# Patient Record
Sex: Male | Born: 1965 | Race: Black or African American | Hispanic: No | Marital: Married | Smoking: Never smoker
Health system: Southern US, Community
[De-identification: ages and names within clinical notes are randomized; demographics above are authoritative.]

## PROBLEM LIST (undated history)

## (undated) DIAGNOSIS — E119 Type 2 diabetes mellitus without complications: Secondary | ICD-10-CM

## (undated) DIAGNOSIS — E785 Hyperlipidemia, unspecified: Secondary | ICD-10-CM

## (undated) HISTORY — DX: Hyperlipidemia, unspecified: E78.5

## (undated) HISTORY — PX: HERNIA REPAIR: SHX51

## (undated) HISTORY — DX: Type 2 diabetes mellitus without complications: E11.9

---

## 2001-04-21 ENCOUNTER — Emergency Department (HOSPITAL_COMMUNITY): Admission: EM | Admit: 2001-04-21 | Discharge: 2001-04-21 | Payer: Self-pay | Admitting: Emergency Medicine

## 2017-03-20 ENCOUNTER — Ambulatory Visit: Payer: Self-pay | Admitting: Physician Assistant

## 2017-03-26 ENCOUNTER — Encounter: Payer: Self-pay | Admitting: Physician Assistant

## 2017-03-26 ENCOUNTER — Telehealth: Payer: Self-pay

## 2017-03-26 ENCOUNTER — Ambulatory Visit (INDEPENDENT_AMBULATORY_CARE_PROVIDER_SITE_OTHER): Payer: 59 | Admitting: Physician Assistant

## 2017-03-26 VITALS — BP 132/80 | HR 86 | Temp 98.8°F | Resp 16 | Ht 72.5 in | Wt 278.8 lb

## 2017-03-26 DIAGNOSIS — E118 Type 2 diabetes mellitus with unspecified complications: Secondary | ICD-10-CM | POA: Diagnosis not present

## 2017-03-26 DIAGNOSIS — E781 Pure hyperglyceridemia: Secondary | ICD-10-CM

## 2017-03-26 LAB — POCT GLYCOSYLATED HEMOGLOBIN (HGB A1C): Hemoglobin A1C: 10.8

## 2017-03-26 MED ORDER — ICOSAPENT ETHYL 1 G PO CAPS: 1.0000 mg | ORAL_CAPSULE | Freq: Every day | ORAL | 1 refills | Status: DC

## 2017-03-26 MED ORDER — METFORMIN HCL ER (MOD) 1000 MG PO TB24: 1000.0000 mg | ORAL_TABLET | Freq: Two times a day (BID) | ORAL | 1 refills | Status: DC

## 2017-03-26 MED ORDER — DAPAGLIFLOZIN PROPANEDIOL 5 MG PO TABS: 5.0000 mg | ORAL_TABLET | Freq: Every day | ORAL | 1 refills | Status: DC

## 2017-03-26 NOTE — Patient Instructions (Addendum)
Please take the medication as prescribed. I would like you to attempt to get in 30 minutes of aerobic activity 4 times per week.   I would like you to return in 6 weeks after taking the VASCEPA, for the lipid panel.  Please fast prior to coming in.   You will then also return in 3 months so we can recheck the diabetes.   Diabetes Mellitus and Food It is important for you to manage your blood sugar (glucose) level. Your blood glucose level can be greatly affected by what you eat. Eating healthier foods in the appropriate amounts throughout the day at about the same time each day will help you control your blood glucose level. It can also help slow or prevent worsening of your diabetes mellitus. Healthy eating may even help you improve the level of your blood pressure and reach or maintain a healthy weight. General recommendations for healthful eating and cooking habits include:  Eating meals and snacks regularly. Avoid going long periods of time without eating to lose weight.  Eating a diet that consists mainly of plant-based foods, such as fruits, vegetables, nuts, legumes, and whole grains.  Using low-heat cooking methods, such as baking, instead of high-heat cooking methods, such as deep frying.  Work with your dietitian to make sure you understand how to use the Nutrition Facts information on food labels. How can food affect me? Carbohydrates Carbohydrates affect your blood glucose level more than any other type of food. Your dietitian will help you determine how many carbohydrates to eat at each meal and teach you how to count carbohydrates. Counting carbohydrates is important to keep your blood glucose at a healthy level, especially if you are using insulin or taking certain medicines for diabetes mellitus. Alcohol Alcohol can cause sudden decreases in blood glucose (hypoglycemia), especially if you use insulin or take certain medicines for diabetes mellitus. Hypoglycemia can be a  life-threatening condition. Symptoms of hypoglycemia (sleepiness, dizziness, and disorientation) are similar to symptoms of having too much alcohol. If your health care provider has given you approval to drink alcohol, do so in moderation and use the following guidelines:  Women should not have more than one drink per day, and men should not have more than two drinks per day. One drink is equal to: ? 12 oz of beer. ? 5 oz of wine. ? 1 oz of hard liquor.  Do not drink on an empty stomach.  Keep yourself hydrated. Have water, diet soda, or unsweetened iced tea.  Regular soda, juice, and other mixers might contain a lot of carbohydrates and should be counted.  What foods are not recommended? As you make food choices, it is important to remember that all foods are not the same. Some foods have fewer nutrients per serving than other foods, even though they might have the same number of calories or carbohydrates. It is difficult to get your body what it needs when you eat foods with fewer nutrients. Examples of foods that you should avoid that are high in calories and carbohydrates but low in nutrients include:  Trans fats (most processed foods list trans fats on the Nutrition Facts label).  Regular soda.  Juice.  Candy.  Sweets, such as cake, pie, doughnuts, and cookies.  Fried foods.  What foods can I eat? Eat nutrient-rich foods, which will nourish your body and keep you healthy. The food you should eat also will depend on several factors, including:  The calories you need.  The medicines you  take.  Your weight.  Your blood glucose level.  Your blood pressure level.  Your cholesterol level.  You should eat a variety of foods, including:  Protein. ? Lean cuts of meat. ? Proteins low in saturated fats, such as fish, egg whites, and beans. Avoid processed meats.  Fruits and vegetables. ? Fruits and vegetables that may help control blood glucose levels, such as apples,  mangoes, and yams.  Dairy products. ? Choose fat-free or low-fat dairy products, such as milk, yogurt, and cheese.  Grains, bread, pasta, and rice. ? Choose whole grain products, such as multigrain bread, whole oats, and brown rice. These foods may help control blood pressure.  Fats. ? Foods containing healthful fats, such as nuts, avocado, olive oil, canola oil, and fish.  Does everyone with diabetes mellitus have the same meal plan? Because every person with diabetes mellitus is different, there is not one meal plan that works for everyone. It is very important that you meet with a dietitian who will help you create a meal plan that is just right for you. This information is not intended to replace advice given to you by your health care provider. Make sure you discuss any questions you have with your health care provider. Document Released: 07/10/2005 Document Revised: 03/20/2016 Document Reviewed: 09/09/2013 Elsevier Interactive Patient Education  2017 [employer].     IF you received an x-ray today, you will receive an invoice from [facility]. Please contact [facility] at [phone] with questions or concerns regarding your invoice.   IF you received labwork today, you will receive an invoice from [facility]. Please contact [facility] at [phone] with questions or concerns regarding your invoice.   Our billing staff will not be able to assist you with questions regarding bills from these companies.  You will be contacted with the lab results as soon as they are available. The fastest way to get your results is to activate your My Chart account. Instructions are located on the last page of this paperwork. If you have not heard from [place] regarding the results in 2 weeks, please contact this office.

## 2017-03-26 NOTE — Telephone Encounter (Signed)
[facility] [place] to cancel the extended release Metformin per Andrew Reeves. [facility] stated that the called in prescription wasn't covered under the patients insurance anyway.

## 2017-03-26 NOTE — Progress Notes (Signed)
PRIMARY CARE AT [facility] [address] [place] [address] [phone]  Date:  03/26/2017   Name:  Andrew Reeves   DOB:  11-04-1965   MRN:  [id number]  PCP:  Joretta Bachelor, PA    History of Present Illness:  Andrew Reeves is a 51 y.o. male patient who presents to PCP with  Chief Complaint  Patient presents with  . Establish Care  . Medication Refill    METFORMIN, VASCEPA, FARXIGA     Patient is new to our practice He is here for medication refill, and establish care. Reports that he checks his blood sugar about 150-185.  He notes that he has been without the farxiga for 1 month.  He is not watching his food intake.  Travels from [place] to [place] for work 5 days a week.  Eats fast food. Also has hx of hypertriglyceridemia.  He has been without the vascepa for about 1 month as well.  Notes no cp, palpitations, sob, numbness or tingling, vision changes, or leg swelling.  Diarrhea may be with the metformin but otherwise normal.  No polydipsia, tremulous, or polyuria.  There are no active problems to display for this patient.   Past Medical History:  Diagnosis Date  . Diabetes mellitus without complication ([place])     History reviewed. No pertinent surgical history.  Social History  Substance Use Topics  . Smoking status: Never Smoker  . Smokeless tobacco: Never Used  . Alcohol use No    Family History  Problem Relation Age of Onset  . Diabetes Mother   . Stroke Father   . Heart disease Father        HEART ATTACK    Not on File  Medication list has been reviewed and updated.  No current outpatient prescriptions on file prior to visit.   No current facility-administered medications on file prior to visit.     ROS ROS otherwise unremarkable unless listed above.  Physical Examination: BP 132/80 (BP Location: Right Arm, Patient Position: Sitting, Cuff Size: Large)   Pulse 86   Temp 98.8 F (37.1 C) (Oral)   Resp 16   Ht 6' 0.5" (1.842 m)    Wt 278 lb 12.8 oz (126.5 kg)   SpO2 96%   BMI 37.29 kg/m  Ideal Body Weight: Weight in (lb) to have BMI = 25: 186.5  Physical Exam  Constitutional: He is oriented to person, place, and time. He appears well-developed and well-nourished. No distress.  HENT:  Head: Normocephalic and atraumatic.  Eyes: Conjunctivae and EOM are normal. Pupils are equal, round, and reactive to light.  Cardiovascular: Normal rate, regular rhythm and normal heart sounds.  Exam reveals no gallop and no friction rub.   No murmur heard. Pulmonary/Chest: Effort normal. No apnea. No respiratory distress. He has no decreased breath sounds. He has no wheezes. He has no rhonchi.  Feet:  Right Foot:  Protective Sensation: 6 sites tested. 6 sites sensed.  Skin Integrity: Negative for ulcer, blister or skin breakdown.  Left Foot:  Protective Sensation: 6 sites tested. 6 sites sensed.  Skin Integrity: Negative for ulcer, blister or skin breakdown.  Neurological: He is alert and oriented to person, place, and time.  Skin: Skin is warm and dry. He is not diaphoretic.  Psychiatric: He has a normal mood and affect. His behavior is normal.    Results for orders placed or performed in visit on 03/26/17  POCT glycosylated hemoglobin (Hb A1C)  Result Value Ref  Range   Hemoglobin A1C 10.8     Assessment and Plan: Andrew Reeves is a 51 y.o. male who is here today for cc of diabetes mellitus, medication refill, and hypertriglyceridemia. Will need to obtain medical records.  Advised to return in 3 months for diabetes recheck. Advised to return in 6 weeks for lipid panel once restarted on the VASCEPA.    Type 2 diabetes mellitus with complication, without long-term current use of insulin ([place]) - Plan: POCT glycosylated hemoglobin (Hb A1C), CMP14+EGFR, Icosapent Ethyl (VASCEPA) 1 g CAPS, dapagliflozin propanediol (FARXIGA) 5 MG TABS tablet, Microalbumin, urine, DISCONTINUED: metFORMIN (GLUMETZA) 1000 MG (MOD) 24 hr  tablet  Hypertriglyceridemia - Plan: Icosapent Ethyl (VASCEPA) 1 g CAPS, Lipid panel  [name], PA-C [facility] 6/3/20188:56 AM

## 2017-03-27 LAB — MICROALBUMIN, URINE: MICROALBUM., U, RANDOM: 45.4 ug/mL

## 2017-03-27 LAB — CMP14+EGFR
A/G RATIO: 1.6 (ref 1.2–2.2)
ALBUMIN: 4.6 g/dL (ref 3.5–5.5)
ALK PHOS: 78 IU/L (ref 39–117)
ALT: 31 IU/L (ref 0–44)
AST: 24 IU/L (ref 0–40)
BILIRUBIN TOTAL: 0.4 mg/dL (ref 0.0–1.2)
BUN / CREAT RATIO: 13 (ref 9–20)
BUN: 12 mg/dL (ref 6–24)
CHLORIDE: 99 mmol/L (ref 96–106)
CO2: 21 mmol/L (ref 18–29)
Calcium: 9.5 mg/dL (ref 8.7–10.2)
Creatinine, Ser: 0.96 mg/dL (ref 0.76–1.27)
GFR calc non Af Amer: 90 mL/min/{1.73_m2} (ref >59)
GFR, EST AFRICAN AMERICAN: 104 mL/min/{1.73_m2} (ref >59)
GLOBULIN, TOTAL: 2.8 g/dL (ref 1.5–4.5)
GLUCOSE: 139 mg/dL — AB (ref 65–99)
Potassium: 4.4 mmol/L (ref 3.5–5.2)
SODIUM: 138 mmol/L (ref 134–144)
TOTAL PROTEIN: 7.4 g/dL (ref 6.0–8.5)

## 2017-03-27 MED ORDER — METFORMIN HCL 1000 MG PO TABS: 1000.0000 mg | ORAL_TABLET | Freq: Two times a day (BID) | ORAL | 3 refills | Status: DC

## 2017-03-27 NOTE — Telephone Encounter (Signed)
Please resend correct dose to pharmacy today, he is out. Not extended release metformin

## 2017-03-27 NOTE — Telephone Encounter (Signed)
I was told it was called in and the extended release was cancelled.  Ordered.  Please advise patient.

## 2017-03-27 NOTE — Telephone Encounter (Signed)
Called plain metformin  to [facility], I was on hold for 10 minutes so I called back and l/m with rx info.  Wife states his vascepa should be bid not qd, so I asked pharmacy to confirm and call us back , wife advised this may take a few days

## 2017-03-29 DIAGNOSIS — E119 Type 2 diabetes mellitus without complications: Secondary | ICD-10-CM | POA: Insufficient documentation

## 2017-04-10 ENCOUNTER — Telehealth: Payer: Self-pay | Admitting: Physician Assistant

## 2017-04-10 ENCOUNTER — Other Ambulatory Visit: Payer: 59 | Admitting: Family Medicine

## 2017-04-10 DIAGNOSIS — E781 Pure hyperglyceridemia: Secondary | ICD-10-CM

## 2017-04-10 NOTE — Telephone Encounter (Signed)
Can you add this please ?

## 2017-04-10 NOTE — Telephone Encounter (Signed)
Steph please advise

## 2017-04-10 NOTE — Telephone Encounter (Signed)
Pt has a lab order for a lipid panel but also wants a CBC drawn can this be added to the lab order   Best number [phone]

## 2017-04-10 NOTE — Telephone Encounter (Signed)
Pts wife is calling again to check and see if pt did a cholesterol and CBC like she had requested.  I advised that I only see a lipid panel screen and that labs typically take a few days to come back and will then be released to MyChart.  Pts wife was not happy with this answer.  Please advise [phone]

## 2017-04-11 LAB — LIPID PANEL
Chol/HDL Ratio: 4 ratio (ref 0.0–5.0)
Cholesterol, Total: 148 mg/dL (ref 100–199)
HDL: 37 mg/dL — AB (ref >39)
LDL Calculated: 70 mg/dL (ref 0–99)
Triglycerides: 203 mg/dL — ABNORMAL HIGH (ref 0–149)
VLDL CHOLESTEROL CAL: 41 mg/dL — AB (ref 5–40)

## 2017-04-11 NOTE — Telephone Encounter (Signed)
Pt wife is requesting an AC1 to be done.

## 2017-04-11 NOTE — Telephone Encounter (Signed)
Please add the CBC to [facility]

## 2017-04-12 LAB — CBC
HEMATOCRIT: 47.9 % (ref 37.5–51.0)
HEMOGLOBIN: 15.4 g/dL (ref 13.0–17.7)
MCH: 26.7 pg (ref 26.6–33.0)
MCHC: 32.2 g/dL (ref 31.5–35.7)
MCV: 83 fL (ref 79–97)
Platelets: 200 10*3/uL (ref 150–379)
RBC: 5.76 x10E6/uL (ref 4.14–5.80)
RDW: 14.5 % (ref 12.3–15.4)
WBC: 5.2 10*3/uL (ref 3.4–10.8)

## 2017-04-12 NOTE — Telephone Encounter (Signed)
Sent in mychart--this was done at visit!

## 2017-04-29 ENCOUNTER — Encounter (HOSPITAL_COMMUNITY): Payer: Self-pay

## 2017-04-29 ENCOUNTER — Emergency Department (HOSPITAL_COMMUNITY)
Admission: EM | Admit: 2017-04-29 | Discharge: 2017-04-29 | Disposition: A | Discharge status: Home / Self Care | Payer: 59 | Attending: Emergency Medicine | Admitting: Emergency Medicine

## 2017-04-29 DIAGNOSIS — E119 Type 2 diabetes mellitus without complications: Secondary | ICD-10-CM | POA: Diagnosis not present

## 2017-04-29 DIAGNOSIS — Z79899 Other long term (current) drug therapy: Secondary | ICD-10-CM | POA: Diagnosis not present

## 2017-04-29 DIAGNOSIS — K1120 Sialoadenitis, unspecified: Secondary | ICD-10-CM | POA: Insufficient documentation

## 2017-04-29 DIAGNOSIS — H66002 Acute suppurative otitis media without spontaneous rupture of ear drum, left ear: Secondary | ICD-10-CM | POA: Insufficient documentation

## 2017-04-29 DIAGNOSIS — Z7984 Long term (current) use of oral hypoglycemic drugs: Secondary | ICD-10-CM | POA: Diagnosis not present

## 2017-04-29 DIAGNOSIS — K0889 Other specified disorders of teeth and supporting structures: Secondary | ICD-10-CM | POA: Diagnosis present

## 2017-04-29 MED ORDER — AMOXICILLIN-POT CLAVULANATE 875-125 MG PO TABS
1.0000 | ORAL_TABLET | Freq: Once | ORAL | Status: AC
  Administered 2017-04-29: 1 via ORAL
  Filled 2017-04-29: qty 1

## 2017-04-29 MED ORDER — NAPROXEN 500 MG PO TABS: 500.0000 mg | ORAL_TABLET | Freq: Two times a day (BID) | ORAL | 0 refills | Status: DC | PRN

## 2017-04-29 MED ORDER — HYDROCODONE-ACETAMINOPHEN 5-325 MG PO TABS: 1.0000 | ORAL_TABLET | Freq: Four times a day (QID) | ORAL | 0 refills | Status: DC | PRN

## 2017-04-29 MED ORDER — AMOXICILLIN-POT CLAVULANATE 875-125 MG PO TABS: 1.0000 | ORAL_TABLET | Freq: Two times a day (BID) | ORAL | 0 refills | Status: DC

## 2017-04-29 MED ORDER — HYDROCODONE-ACETAMINOPHEN 5-325 MG PO TABS
1.0000 | ORAL_TABLET | Freq: Once | ORAL | Status: AC
  Administered 2017-04-29: 1 via ORAL
  Filled 2017-04-29: qty 1

## 2017-04-29 NOTE — ED Triage Notes (Signed)
PT C/O LEFT BOTTOM TOOTH PAIN X1 DAY. PT DENIES FEVER OR DRAINAGE AT HOME. DENIES INJURY.

## 2017-04-29 NOTE — Discharge Instructions (Signed)
You appear to have either an early ear infection, or possibly an infection in one of the saliva glands in your jaw. Take the antibiotic as directed. Take naprosyn and norco as directed, as needed for pain but do not drive or operate machinery with pain medication use. Swish warm salt water around to help with pain. Use sour candies or other sour liquids to help with the infection. Use a warm compress to the area to help with pain. Follow up with your regular doctor in 5-7 days for recheck of symptoms. Return to the ER for emergent changes or worsening symptoms.

## 2017-04-29 NOTE — ED Notes (Signed)
PT DISCHARGED. INSTRUCTIONS AND PRESCRIPTIONS GIVEN. AAOX4. PT IN NO APPARENT DISTRESS WITH SEVERE PAIN. THE OPPORTUNITY TO ASK QUESTIONS WAS PROVIDED. 

## 2017-04-29 NOTE — ED Provider Notes (Signed)
[facility] DEPT Provider Note   CSN: [id number] Arrival date & time: 04/29/17  1626  By signing my name below, I, Evelene Croon, attest that this documentation has been prepared under the direction and in the presence of [address], [employer]. Electronically Signed: Evelene Croon, Scribe. 04/29/2017. 4:59 PM.   History   Chief Complaint Chief Complaint  Patient presents with  . Dental Pain     The history is provided by the patient and medical records. No language interpreter was used.  Dental Pain   This is a new problem. The current episode started 3 to 5 hours ago. The problem occurs constantly. The problem has not changed since onset.The pain is at a severity of 10/10. The pain is severe. Treatments tried: ibuprofen. The treatment provided no relief.    HPI Comments: Andrew Reeves is a 51 y.o. male with a PMHx of DM2, who presents to the Emergency Department complaining of L lower mouth/jaw/dental pain that began when he awoke this morning. He describes the pain as 10/10 constant sharp nonradiating lower left gumline/mouth pain. His pain is worse when exposed to cold. He has taken motrin without relief; took ~400mg . He notes associated swelling under the L jawline, that he believes is a lymph node. No dental injury or caries in that area, states he's had all of those teeth in the posterior L lower side pulled. No drainage or swelling from the gums. He denies any fever, chills, ear pain/drainage, sore throat, difficulty swallowing, trismus, drooling, CP, SOB, abd pain, nausea, vomiting, diarrhea, constipation, dysuria, hematuria, numbness/tingling, focal weakness, or any other complaints at this time. He is not a smoker. Pt states he just moved back to [place] from [place] and will be able to find dental follow but could not do so today due to the holiday. He does have a PCP here. NKDA.    Past Medical History:  Diagnosis Date  . Diabetes mellitus without complication ([place])     TYPE II    Patient Active Problem List   Diagnosis Date Noted  . Diabetes ([place]) 03/29/2017    History reviewed. No pertinent surgical history.     Home Medications    Prior to Admission medications   Medication Sig Start Date End Date Taking? Authorizing Provider  dapagliflozin propanediol (FARXIGA) 5 MG TABS tablet Take 5 mg by mouth daily. 03/26/17   Joretta Bachelor, PA  Icosapent Ethyl (VASCEPA) 1 g CAPS Take 1 mg by mouth daily. 03/26/17   [name] D, PA  metFORMIN (GLUCOPHAGE) 1000 MG tablet Take 1 tablet (1,000 mg total) by mouth 2 (two) times daily with a meal. 03/27/17   Joretta Bachelor, PA    Family History Family History  Problem Relation Age of Onset  . Diabetes Mother   . Stroke Father   . Heart disease Father        HEART ATTACK    Social History Social History  Substance Use Topics  . Smoking status: Never Smoker  . Smokeless tobacco: Never Used  . Alcohol use No     Allergies   Patient has no known allergies.   Review of Systems Review of Systems  Constitutional: Negative for chills and fever.  HENT: Positive for dental problem (L lower jaw/mouth pain). Negative for drooling, ear discharge, ear pain, sore throat and trouble swallowing.   Respiratory: Negative for shortness of breath.   Cardiovascular: Negative for chest pain.  Gastrointestinal: Negative for abdominal pain, constipation, diarrhea, nausea and vomiting.  Genitourinary: Negative for dysuria and hematuria.  Musculoskeletal: Negative for arthralgias and myalgias.  Skin: Negative for color change.  Allergic/Immunologic: Positive for immunocompromised state (DM2).  Neurological: Negative for weakness and numbness.  Hematological: Positive for adenopathy (L lower jaw).  Psychiatric/Behavioral: Negative for confusion.   All systems reviewed and are negative for acute change except as noted in the HPI.    Physical Exam Updated Vital Signs BP (!) 156/101 (BP  Location: Left Arm)   Pulse 72   Temp 98.4 F (36.9 C) (Oral)   Resp 16   Ht 6\' 2"  (1.88 m)   Wt 270 lb (122.5 kg)   SpO2 99%   BMI 34.67 kg/m   Physical Exam  Constitutional: He is oriented to person, place, and time. Vital signs are normal. He appears well-developed and well-nourished.  Non-toxic appearance. No distress.  Afebrile, nontoxic, NAD  HENT:  Head: Normocephalic and atraumatic.  Right Ear: Hearing, tympanic membrane, external ear and ear canal normal.  Left Ear: Hearing, external ear and ear canal normal. Tympanic membrane is injected and bulging. A middle ear effusion is present.  Nose: Nose normal.  Mouth/Throat: Uvula is midline, oropharynx is clear and moist and mucous membranes are normal. No trismus in the jaw. No dental abscesses or uvula swelling. Tonsils are 0 on the right. Tonsils are 0 on the left. No tonsillar exudate.  Right ear clear, left ear with clear canal but slightly suppurative appearing effusion with injected and bulging TM. Nose clear. Surgically absent posterior lower molars bilaterally, no dental tenderness or gum swelling in all other areas of the mouth, no dental abscess or gum drainage elsewhere or in the area of reported pain. Mild erythema near stensen's duct, no obvious blockage noted. Oropharynx clear and moist, without uvular swelling or deviation, no trismus or drooling, no tonsillar swelling or erythema, no exudates. No evidence of ludwig's  Eyes: Conjunctivae and EOM are normal. Right eye exhibits no discharge. Left eye exhibits no discharge.  Neck: Normal range of motion. Neck supple.  Cardiovascular: Normal rate and intact distal pulses.   Pulmonary/Chest: Effort normal. No respiratory distress.  Abdominal: Normal appearance. He exhibits no distension.  Musculoskeletal: Normal range of motion.  Lymphadenopathy:       Head (left side): Submandibular and tonsillar adenopathy present.  Mild left parotid gland tenderness and slight  enlargement, left submandibular and tonsillar LAD which is mildly TTP    Neurological: He is alert and oriented to person, place, and time. He has normal strength. No sensory deficit.  Skin: Skin is warm, dry and intact. No rash noted.  Psychiatric: He has a normal mood and affect.  Nursing note and vitals reviewed.    ED Treatments / Results  DIAGNOSTIC STUDIES:  Oxygen Saturation is 99% on RA, normal by my interpretation.    COORDINATION OF CARE:  4:59 PM Discussed treatment plan with pt at bedside and pt agreed to plan.  Labs (all labs ordered are listed, but only abnormal results are displayed) Labs Reviewed - No data to display  EKG  EKG Interpretation None       Radiology No results found.  Procedures Procedures (including critical care time)  Medications Ordered in ED Medications  HYDROcodone-acetaminophen (NORCO/VICODIN) 5-325 MG per tablet 1 tablet (not administered)  amoxicillin-clavulanate (AUGMENTIN) 875-125 MG per tablet 1 tablet (not administered)     Initial Impression / Assessment and Plan / ED Course  I have reviewed the triage vital signs and the nursing notes.  Pertinent  labs & imaging results that were available during my care of the patient were reviewed by me and considered in my medical decision making (see chart for details).     51 y.o. male here with L lower jaw/mouth pain onset this morning. No dentitia in posterior molar region, slight erythema near stensen's duct, no blockage seen, mild parotid fullness and tenderness, mild submandibular gland tenderness, +LAD in this area; L TM slightly bulging and injected with suppurative appearing effusion present. Afebrile, nontoxic, handling secretions well, no evidence of ludwig's. Seems like it may be either early AOM of L ear, or sialoadenitis/parotitis. Either way, treatment is the same. Will send home with pain meds and augmentin. Advised warm salt water swishes, warm compresses, lemon/sour  candy, and f/up with PCP in 5-7 days for recheck. [place] reviewed prior to dispensing controlled substance medications, and 1 year search was notable for: no controlled substances found. Risks/benefits/alternatives and expectations discussed regarding controlled substances. Side effects of medications discussed. Informed consent obtained. I explained the diagnosis and have given explicit precautions to return to the ER including for any other new or worsening symptoms. The patient understands and accepts the medical plan as it's been dictated and I have answered their questions. Discharge instructions concerning home care and prescriptions have been given. The patient is STABLE and is discharged to home in good condition.   I personally performed the services described in this documentation, which was scribed in my presence. The recorded information has been reviewed and is accurate.   Final Clinical Impressions(s) / ED Diagnoses   Final diagnoses:  Acute suppurative otitis media of left ear without spontaneous rupture of tympanic membrane, recurrence not specified  Sialoadenitis  Parotitis    New Prescriptions New Prescriptions   AMOXICILLIN-CLAVULANATE (AUGMENTIN) 875-125 MG TABLET    Take 1 tablet by mouth 2 (two) times daily. One po bid x 7 days   HYDROCODONE-ACETAMINOPHEN (NORCO) 5-325 MG TABLET    Take 1 tablet by mouth every 6 (six) hours as needed for severe pain.   NAPROXEN (NAPROSYN) 500 MG TABLET    Take 1 tablet (500 mg total) by mouth 2 (two) times daily as needed for mild pain, moderate pain or headache (TAKE WITH MEALS.).       [address] [place], [place] 04/29/17 1719    Lacretia Leigh, MD 04/29/17 [phone]

## 2017-05-23 ENCOUNTER — Other Ambulatory Visit: Payer: Self-pay | Admitting: Physician Assistant

## 2017-05-23 DIAGNOSIS — E781 Pure hyperglyceridemia: Secondary | ICD-10-CM

## 2017-05-23 DIAGNOSIS — E118 Type 2 diabetes mellitus with unspecified complications: Secondary | ICD-10-CM

## 2017-05-26 ENCOUNTER — Ambulatory Visit: Payer: Self-pay | Admitting: Physician Assistant

## 2017-06-01 ENCOUNTER — Ambulatory Visit: Payer: 59 | Admitting: Physician Assistant

## 2017-06-05 ENCOUNTER — Ambulatory Visit: Payer: Self-pay | Admitting: Physician Assistant

## 2017-06-05 ENCOUNTER — Ambulatory Visit (INDEPENDENT_AMBULATORY_CARE_PROVIDER_SITE_OTHER): Payer: Self-pay | Admitting: Physician Assistant

## 2017-06-05 VITALS — BP 152/102 | HR 66 | Temp 97.5°F | Resp 16 | Ht 74.0 in | Wt 278.0 lb

## 2017-06-05 DIAGNOSIS — R030 Elevated blood-pressure reading, without diagnosis of hypertension: Secondary | ICD-10-CM

## 2017-06-05 DIAGNOSIS — K068 Other specified disorders of gingiva and edentulous alveolar ridge: Secondary | ICD-10-CM

## 2017-06-05 DIAGNOSIS — R6884 Jaw pain: Secondary | ICD-10-CM

## 2017-06-05 MED ORDER — AMOXICILLIN-POT CLAVULANATE 875-125 MG PO TABS: 1.0000 | ORAL_TABLET | Freq: Two times a day (BID) | ORAL | 0 refills | Status: DC

## 2017-06-05 MED ORDER — NAPROXEN 500 MG PO TABS: 500.0000 mg | ORAL_TABLET | Freq: Two times a day (BID) | ORAL | 1 refills | Status: DC | PRN

## 2017-06-05 NOTE — Patient Instructions (Addendum)
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 I have given you some educational material on TMJ below. Please take naproxen daily for the next 1-2 weeks and start performing the exercises below as tolerated. If you are still having pain after one week, I have given you a presciption for an antibiotic. However, I do not think this is due to an infection. If you develop worsening gum pain, fever, or chills, please take the antibioitic immediately and notify a dentist. Follow up with a dentist as planned. After taking naproxen for 1-2 weeks, please try to use only as needed. If still having pain in one month, please consider a referral to physical therapy.    In terms of elevated blood pressure, I would like you to check your blood pressure at least a couple times over the next week outside of the office and document these values. It is best if you check the blood pressure at different times in the day. Your goal is <140/90. If your values are consistently above this goal, please return to office for further evaluation. If you start to have chest pain, blurred vision, shortness of breath, severe headache, lower leg swelling, or nausea/vomiting please seek care immediately here or at the ED.   Thank you for letting me participate in your health and well being.   Temporomandibular Joint Syndrome Temporomandibular joint (TMJ) syndrome is a condition that affects the joints between your jaw and your skull. The TMJs are located near your ears and allow your jaw to open and close. These joints and the nearby muscles are involved in all movements of the jaw. People with TMJ syndrome have pain in the area of these joints and muscles. Chewing, biting, or other movements of the jaw can be difficult or painful. TMJ syndrome can be caused by various things. In many cases, the condition is mild and goes away within a few weeks. For some people, the condition can become a long-term problem. What are the causes? Possible causes  of TMJ syndrome include:  Grinding your teeth or clenching your jaw. Some people do this when they are under stress.  Arthritis.  Injury to the jaw.  Head or neck injury.  Teeth or dentures that are not aligned well.  In some cases, the cause of TMJ syndrome may not be known. What are the signs or symptoms? The most common symptom is an aching pain on the side of the head in the area of the TMJ. Other symptoms may include:  Pain when moving your jaw, such as when chewing or biting.  Being unable to open your jaw all the way.  Making a clicking sound when you open your mouth.  Headache.  Earache.  Neck or shoulder pain.  How is this diagnosed? Diagnosis can usually be made based on your symptoms, your medical history, and a physical exam. Your health care provider may check the range of motion of your jaw. Imaging tests, such as X-rays or an MRI, are sometimes done. You may need to see your dentist to determine if your teeth and jaw are lined up correctly. How is this treated? TMJ syndrome often goes away on its own. If treatment is needed, the options may include:  Eating soft foods and applying ice or heat.  Medicines to relieve pain or inflammation.  Medicines to relax the muscles.  A splint, bite plate, or mouthpiece to prevent teeth grinding or jaw clenching.  Relaxation techniques or counseling to help reduce stress.  Transcutaneous electrical nerve  stimulation (TENS). This helps to relieve pain by applying an electrical current through the skin.  Acupuncture. This is sometimes helpful to relieve pain.  Jaw surgery. This is rarely needed.  Follow these instructions at home:  Take medicines only as directed by your health care provider.  Eat a soft diet if you are having trouble chewing.  Apply ice to the painful area. ? Put ice in a plastic bag. ? Place a towel between your skin and the bag. ? Leave the ice on for 20 minutes, 2-3 times a day.  Apply a  warm compress to the painful area as directed.  Massage your jaw area and perform any jaw stretching exercises as recommended by your health care provider.  If you were given a mouthpiece or bite plate, wear it as directed.  Avoid foods that require a lot of chewing. Do not chew gum.  Keep all follow-up visits as directed by your health care provider. This is important. Contact a health care provider if:  You are having trouble eating.  You have new or worsening symptoms. Get help right away if:  Your jaw locks open or closed. This information is not intended to replace advice given to you by your health care provider. Make sure you discuss any questions you have with your health care provider. Document Released: 07/08/2001 Document Revised: 06/12/2016 Document Reviewed: 05/18/2014 Elsevier Interactive Patient Education  2018 [employer].  Jaw Range of Motion Exercises Jaw range of motion exercises are exercises that help your jaw to move better. These exercises can help to prevent:  Difficulty opening your mouth.  Pain in your jaw while it is both open and closed.  What should I be careful of when doing jaw exercises? Make sure that you only do jaw exercises as directed by your health care provider. You should only move your jaw as far as it can go in each direction, if told to do so by your health care provider. Do not move your jaw into positions that cause you any pain. What exercises should I do?  Stick your jaw forward. Hold this position for 1-2 seconds. Allow your jaw to return to its normal position and rest it there for 1-2 seconds. Do this exercise 8 times.  Stand or sit in front of a mirror. Place your tongue on the roof of your mouth, just behind your top teeth. Slowly open and close your jaw, keeping your tongue on the roof of your mouth. While you open and close your mouth, try to keep your jaw from moving toward one side or the other. Repeat this 8 times.  Move  your jaw right. Hold this position for 1-2 seconds. Allow your jaw to return to its normal position, and rest it there for 1-2 seconds. Do this exercise 8 times.  Move your jaw left. Hold this position for 1-2 seconds. Allow your jaw to return to its normal position, and rest it there for 1-2 seconds. Do this exercise 8 times.  Open your mouth as far as it is can comfortably go. Hold this position for 1-2 seconds. Then close your mouth and rest for 1-2 seconds. Do this exercise 8 times.  Move your jaw in a circular motion, starting toward the right (clockwise). Repeat this 8 times.  Move your jaw in a circular motion, starting toward the left (counterclockwise). Repeat this 8 times. Apply moist heat packs or ice packs to your jaw before or after performing your exercises as directed by your  health care provider. What else can I do? Avoid the following, if they cause jaw pain or they increase your jaw pain:  Chewing gum.  Clenching your jaw or teeth or keeping tension in your jaw muscles.  Leaning on your jaw, such as resting your jaw in your hand while leaning on a desk.  This information is not intended to replace advice given to you by your health care provider. Make sure you discuss any questions you have with your health care provider. Document Released: 09/25/2008 Document Revised: 03/20/2016 Document Reviewed: 09/13/2014 Elsevier Interactive Patient Education  2018 [employer].   IF you received an x-ray today, you will receive an invoice from [facility]. Please contact [facility] at [phone] with questions or concerns regarding your invoice.   IF you received labwork today, you will receive an invoice from [facility]. Please contact [facility] at [phone] with questions or concerns regarding your invoice.   Our billing staff will not be able to assist you with questions regarding bills from these companies.  You will be contacted with the lab results as  soon as they are available. The fastest way to get your results is to activate your My Chart account. Instructions are located on the last page of this paperwork. If you have not heard from [place] regarding the results in 2 weeks, please contact this office.

## 2017-06-05 NOTE — Progress Notes (Signed)
Andrew Reeves  MRN: [id number] DOB: [birth year]  Subjective:  Andrew Reeves is a 51 y.o. male seen in office today for a chief complaint of left jaw pain x 1 month. Describes as stabbing sensation. It occurs pretty constantly now. Has associated left ear pain and left jaw swelling. Denies prior jaw injury, fever, chills, gum pain, headache, nausea, and vomiting. Will occasionally have pain on the right side. He grinds his teeth at night time. Drinks a lot of water lately. Tried sucking on a hard sour candy and that did not help.Was seen in the ED on 04/29/17 for this issue. Tx for aucte supparuateive otitis media and sialoadentitis. The pain had resolved with augmentin and naproxen but then returned. Has dentist appointment scheduled for 06/2017 as he has had a lot of dental work in the past. Has had the bottom molars removed on both sides quite a while ago and received gum cleansing treatment for some period of time.  Review of Systems  Constitutional: Negative for activity change and appetite change.  HENT: Negative for ear discharge, sinus pressure, sneezing, sore throat and trouble swallowing.   Eyes: Negative for visual disturbance.  Respiratory: Negative for cough and shortness of breath.   Cardiovascular: Negative for chest pain and palpitations.    Patient Active Problem List   Diagnosis Date Noted  . Diabetes ([place]) 03/29/2017    Current Outpatient Prescriptions on File Prior to Visit  Medication Sig Dispense Refill  . dapagliflozin propanediol (FARXIGA) 5 MG TABS tablet Take 5 mg by mouth daily. 90 tablet 1  . Icosapent Ethyl (VASCEPA) 1 g CAPS Take 1 mg by mouth daily. 120 capsule 1  . metFORMIN (GLUCOPHAGE) 1000 MG tablet Take 1 tablet (1,000 mg total) by mouth 2 (two) times daily with a meal. 180 tablet 3  . HYDROcodone-acetaminophen (NORCO) 5-325 MG tablet Take 1 tablet by mouth every 6 (six) hours as needed for severe pain. (Patient not taking: Reported on 06/05/2017) 10 tablet  0  . naproxen (NAPROSYN) 500 MG tablet Take 1 tablet (500 mg total) by mouth 2 (two) times daily as needed for mild pain, moderate pain or headache (TAKE WITH MEALS.). (Patient not taking: Reported on 06/05/2017) 20 tablet 0   No current facility-administered medications on file prior to visit.     No Known Allergies   Objective:  BP (!) 152/102   Pulse 66   Temp (!) 97.5 F (36.4 C) (Oral)   Resp 16   Ht 6\' 2"  (1.88 m)   Wt 278 lb (126.1 kg)   SpO2 99%   BMI 35.69 kg/m   Physical Exam  Constitutional: He is oriented to person, place, and time and well-developed, well-nourished, and in no distress.  HENT:  Head: Normocephalic and atraumatic.  Right Ear: Tympanic membrane, external ear and ear canal normal.  Left Ear: Tympanic membrane, external ear and ear canal normal.  Mouth/Throat: Oropharynx is clear and moist and mucous membranes are normal. Abnormal dentition (lower molars not present bilaterally, there is mild tenderness with palpation of right lower gum, no fluctuenace or warmth palpated ). No dental abscesses.  Pain with deep palpation of TMJ bilaterally, most notable on left side.  Clicking sensation noted at TMJ. Tenderness with palpation of masseter and temporalis muscle of left side.  Decreased ROM of jaw with opening and closing mouth. No salivary duct stones noted.  Eyes: Conjunctivae are normal.  Neck: Normal range of motion.  Cardiovascular: Normal rate, regular rhythm and normal  heart sounds.   Pulmonary/Chest: Effort normal.  Neurological: He is alert and oriented to person, place, and time. Gait normal.  Skin: Skin is warm and dry.  Psychiatric: Affect normal.  Vitals reviewed.   Assessment and Plan :   1. Jaw pain History and PE findings consistent with TMJ. Given Rx fo NSAIDs. Given educational material for jaw ROM exercises. Encouraged to return to clinic if symptoms worsen, do not improve in 4 weeks, or as needed. Consider PT referral if no  improvement at that time.  - naproxen (NAPROSYN) 500 MG tablet; Take 1 tablet (500 mg total) by mouth 2 (two) times daily as needed for mild pain, moderate pain or headache (TAKE WITH MEALS.).  Dispense: 30 tablet; Refill: 1  2. Pain in gums No acute signs of infection. However, I have given patient a printed Rx for augmentin to use if his gum pain does not improve with tx of TMJ. Encouraged to follow up with dentist.  - amoxicillin-clavulanate (AUGMENTIN) 875-125 MG tablet; Take 1 tablet by mouth 2 (two) times daily. One po bid x 7 days  Dispense: 14 tablet; Refill: 0  3. Elevated blood pressure reading Asymptomatic. Instructed to check bp outside of office over the next couple of weeks. Return if consistently >140/90. Given strict ED precautions.     Tenna Delaine PA-C  [facility] 06/05/2017 4:13 PM

## 2017-06-08 ENCOUNTER — Encounter: Payer: Self-pay | Admitting: Physician Assistant

## 2017-06-11 ENCOUNTER — Other Ambulatory Visit: Payer: Self-pay | Admitting: Physician Assistant

## 2017-06-11 DIAGNOSIS — E118 Type 2 diabetes mellitus with unspecified complications: Secondary | ICD-10-CM

## 2017-06-11 DIAGNOSIS — E781 Pure hyperglyceridemia: Secondary | ICD-10-CM

## 2017-06-24 ENCOUNTER — Telehealth: Payer: Self-pay | Admitting: Physician Assistant

## 2017-06-24 DIAGNOSIS — R6884 Jaw pain: Secondary | ICD-10-CM

## 2017-06-24 NOTE — Telephone Encounter (Signed)
Pt is needing more pain meds for his jaw pain til Wednesday for his dental appt   Best number [phone]

## 2017-06-25 NOTE — Telephone Encounter (Signed)
See message below at pt needing more pain medication Please advise

## 2017-06-26 NOTE — Telephone Encounter (Signed)
Please call pt and let him know that the original Rx I gave him for naproxen for pain was for #30 tablets with #1 refill so he should not need an additional refill. Please let me know if he has any other questions. Thanks!

## 2017-06-30 NOTE — Telephone Encounter (Signed)
Left vm for pt and let him know that the original Rx for  for pain was for 30 tablets with 1 refill and if he had any questions to please call our office

## 2017-07-07 ENCOUNTER — Telehealth: Payer: Self-pay

## 2017-07-07 NOTE — Telephone Encounter (Signed)
Filled out request on Cover my Meds for patient for Andrew Reeves.  Key is [id number]. Recheck in 72 hours.

## 2017-07-09 NOTE — Telephone Encounter (Signed)
Please disregard previous message.  It was entered in error.

## 2017-07-09 NOTE — Telephone Encounter (Signed)
Checked on patient's [employer] and it still has not been approved or denied.

## 2017-07-09 NOTE — Telephone Encounter (Signed)
Received a cancelled request for this request on cover my meds. Stating this med may not need prior auth or their insurance is invalid.  Tried to contact patient with no answer. Contacted pharmacy and they did a test run and the medication was accepted.  They will contact the patient.

## 2017-07-14 NOTE — Telephone Encounter (Signed)
Received response for request for farxiga.  No PA is needed at this time.  Call to pharmacy to notify.

## 2017-07-21 ENCOUNTER — Telehealth: Payer: Self-pay

## 2017-07-21 NOTE — Telephone Encounter (Signed)
Started PA on cover my meds.  Key is [id number].  Should be ready with approval/denial within 5 business days.

## 2017-07-29 NOTE — Telephone Encounter (Signed)
Result today stating that Insurance ([employer]) will not cover Vascepa, and recommends that patient tried Omega 3 Fatty Acid OTC.  Pharmacy and provider aware.  Pharmacy will notify patient.

## 2017-08-11 ENCOUNTER — Telehealth: Payer: Self-pay

## 2017-08-11 MED ORDER — FENOFIBRATE 145 MG PO TABS: 145.0000 mg | ORAL_TABLET | Freq: Every day | ORAL | 1 refills | Status: DC

## 2017-08-11 NOTE — Telephone Encounter (Signed)
I called patient to advise of Stephanie's message, however, it went to his VM.  LMVM for him to CB to discuss a medication change.

## 2017-08-11 NOTE — Telephone Encounter (Signed)
This is for your review, Renay.  If you can call patient, or forward to clinical pool to advise patient of the change due to coverage. Let's do the tricor.  Please advise that we are sending this. Reviewed history of hypertriglyceridemia.  We should be have returned in September.  Please have him follow up.

## 2017-08-11 NOTE — Telephone Encounter (Signed)
Col[place]ta [place], Received a request for a PA through Cover My Meds on Vascepa.  I see you prescribed it in August.  Insurance is denying it as non-formulary wanting a trial and fail of one of the following:  A Statin, Lovaza, Lofibra, Zetia or Tricor. Please advise, Harlo Jaso

## 2017-08-12 NOTE — Telephone Encounter (Signed)
LMVM for patient to CB to discuss medication change.

## 2017-08-18 NOTE — Telephone Encounter (Signed)
I have called twice per Stephanie's request.  Please send patient a could not reach letter, so that he can call us back. Thanks!

## 2017-08-20 ENCOUNTER — Telehealth: Payer: Self-pay | Admitting: Physician Assistant

## 2017-08-20 NOTE — Telephone Encounter (Signed)
Pharmacy is calling to request a verbal order for pt's [place].  Please call Olivia Mackie back at [phone]

## 2017-08-20 NOTE — Telephone Encounter (Signed)
Pt is on MyChart - Letter sent -unable to get in touch with.

## 2017-08-24 ENCOUNTER — Other Ambulatory Visit: Payer: Self-pay

## 2017-08-24 DIAGNOSIS — E118 Type 2 diabetes mellitus with unspecified complications: Secondary | ICD-10-CM

## 2017-08-24 MED ORDER — DAPAGLIFLOZIN PROPANEDIOL 5 MG PO TABS: 5.0000 mg | ORAL_TABLET | Freq: Every day | ORAL | 0 refills | Status: DC

## 2017-08-24 NOTE — Telephone Encounter (Signed)
Refilled for 30 days. Needs OV for additional refills.

## 2017-11-19 ENCOUNTER — Telehealth: Payer: Self-pay | Admitting: Physician Assistant

## 2017-11-19 ENCOUNTER — Ambulatory Visit: Payer: Self-pay | Admitting: Physician Assistant

## 2017-11-19 ENCOUNTER — Ambulatory Visit (INDEPENDENT_AMBULATORY_CARE_PROVIDER_SITE_OTHER): Payer: 59 | Admitting: Physician Assistant

## 2017-11-19 ENCOUNTER — Encounter: Payer: Self-pay | Admitting: Physician Assistant

## 2017-11-19 VITALS — BP 153/96 | HR 79 | Temp 98.7°F | Resp 16 | Ht 74.0 in | Wt 285.0 lb

## 2017-11-19 DIAGNOSIS — E118 Type 2 diabetes mellitus with unspecified complications: Secondary | ICD-10-CM | POA: Diagnosis not present

## 2017-11-19 DIAGNOSIS — E781 Pure hyperglyceridemia: Secondary | ICD-10-CM

## 2017-11-19 LAB — POCT GLYCOSYLATED HEMOGLOBIN (HGB A1C): HEMOGLOBIN A1C: 9.5

## 2017-11-19 MED ORDER — DAPAGLIFLOZIN PRO-METFORMIN ER 10-1000 MG PO TB24: 1.0000 | ORAL_TABLET | Freq: Two times a day (BID) | ORAL | 1 refills | Status: DC

## 2017-11-19 MED ORDER — PRAVASTATIN SODIUM 20 MG PO TABS: 20.0000 mg | ORAL_TABLET | Freq: Every day | ORAL | 3 refills | Status: DC

## 2017-11-19 NOTE — Telephone Encounter (Signed)
He can return within the month for lipid panel, but as discussed at the visit, he was not fasting.   Also he does not need his cbc rechecked.  This can be done yearly.

## 2017-11-19 NOTE — Patient Instructions (Addendum)
We have agreed to cut out pastas, breads, and rices at this time.  You will reduce your potato intake.    Diabetes Mellitus and Nutrition When you have diabetes (diabetes mellitus), it is very important to have healthy eating habits because your blood sugar (glucose) levels are greatly affected by what you eat and drink. Eating healthy foods in the appropriate amounts, at about the same times every day, can help you:  Control your blood glucose.  Lower your risk of heart disease.  Improve your blood pressure.  Reach or maintain a healthy weight.  Every person with diabetes is different, and each person has different needs for a meal plan. Your health care provider may recommend that you work with a diet and nutrition specialist (dietitian) to make a meal plan that is best for you. Your meal plan may vary depending on factors such as:  The calories you need.  The medicines you take.  Your weight.  Your blood glucose, blood pressure, and cholesterol levels.  Your activity level.  Other health conditions you have, such as heart or kidney disease.  How do carbohydrates affect me? Carbohydrates affect your blood glucose level more than any other type of food. Eating carbohydrates naturally increases the amount of glucose in your blood. Carbohydrate counting is a method for keeping track of how many carbohydrates you eat. Counting carbohydrates is important to keep your blood glucose at a healthy level, especially if you use insulin or take certain oral diabetes medicines. It is important to know how many carbohydrates you can safely have in each meal. This is different for every person. Your dietitian can help you calculate how many carbohydrates you should have at each meal and for snack. Foods that contain carbohydrates include:  Bread, cereal, rice, pasta, and crackers.  Potatoes and corn.  Peas, beans, and lentils.  Milk and yogurt.  Fruit and juice.  Desserts, such as cakes,  cookies, ice cream, and candy.  How does alcohol affect me? Alcohol can cause a sudden decrease in blood glucose (hypoglycemia), especially if you use insulin or take certain oral diabetes medicines. Hypoglycemia can be a life-threatening condition. Symptoms of hypoglycemia (sleepiness, dizziness, and confusion) are similar to symptoms of having too much alcohol. If your health care provider says that alcohol is safe for you, follow these guidelines:  Limit alcohol intake to no more than 1 drink per day for nonpregnant women and 2 drinks per day for men. One drink equals 12 oz of beer, 5 oz of wine, or 1 oz of hard liquor.  Do not drink on an empty stomach.  Keep yourself hydrated with water, diet soda, or unsweetened iced tea.  Keep in mind that regular soda, juice, and other mixers may contain a lot of sugar and must be counted as carbohydrates.  What are tips for following this plan? Reading food labels  Start by checking the serving size on the label. The amount of calories, carbohydrates, fats, and other nutrients listed on the label are based on one serving of the food. Many foods contain more than one serving per package.  Check the total grams (g) of carbohydrates in one serving. You can calculate the number of servings of carbohydrates in one serving by dividing the total carbohydrates by 15. For example, if a food has 30 g of total carbohydrates, it would be equal to 2 servings of carbohydrates.  Check the number of grams (g) of saturated and trans fats in one serving. Choose foods  that have low or no amount of these fats.  Check the number of milligrams (mg) of sodium in one serving. Most people should limit total sodium intake to less than 2,300 mg per day.  Always check the nutrition information of foods labeled as "low-fat" or "nonfat". These foods may be higher in added sugar or refined carbohydrates and should be avoided.  Talk to your dietitian to identify your daily  goals for nutrients listed on the label. Shopping  Avoid buying canned, premade, or processed foods. These foods tend to be high in fat, sodium, and added sugar.  Shop around the outside edge of the grocery store. This includes fresh fruits and vegetables, bulk grains, fresh meats, and fresh dairy. Cooking  Use low-heat cooking methods, such as baking, instead of high-heat cooking methods like deep frying.  Cook using healthy oils, such as olive, canola, or sunflower oil.  Avoid cooking with butter, cream, or high-fat meats. Meal planning  Eat meals and snacks regularly, preferably at the same times every day. Avoid going long periods of time without eating.  Eat foods high in fiber, such as fresh fruits, vegetables, beans, and whole grains. Talk to your dietitian about how many servings of carbohydrates you can eat at each meal.  Eat 4-6 ounces of lean protein each day, such as lean meat, chicken, fish, eggs, or tofu. 1 ounce is equal to 1 ounce of meat, chicken, or fish, 1 egg, or 1/4 cup of tofu.  Eat some foods each day that contain healthy fats, such as avocado, nuts, seeds, and fish. Lifestyle   Check your blood glucose regularly.  Exercise at least 30 minutes 5 or more days each week, or as told by your health care provider.  Take medicines as told by your health care provider.  Do not use any products that contain nicotine or tobacco, such as cigarettes and e-cigarettes. If you need help quitting, ask your health care provider.  Work with a Social worker or diabetes educator to identify strategies to manage stress and any emotional and social challenges. What are some questions to ask my health care provider?  Do I need to meet with a diabetes educator?  Do I need to meet with a dietitian?  What number can I call if I have questions?  When are the best times to check my blood glucose? Where to find more information:  American Diabetes Association:  diabetes.org/food-and-fitness/food  Academy of Nutrition and Dietetics: PokerClues.dk  [employer] of Diabetes and Digestive and Kidney Diseases (NIH): ContactWire.be Summary  A healthy meal plan will help you control your blood glucose and maintain a healthy lifestyle.  Working with a diet and nutrition specialist (dietitian) can help you make a meal plan that is best for you.  Keep in mind that carbohydrates and alcohol have immediate effects on your blood glucose levels. It is important to count carbohydrates and to use alcohol carefully. This information is not intended to replace advice given to you by your health care provider. Make sure you discuss any questions you have with your health care provider. Document Released: 07/10/2005 Document Revised: 11/17/2016 Document Reviewed: 11/17/2016 Elsevier Interactive Patient Education  2018 [employer].      IF you received an x-ray today, you will receive an invoice from [facility] Rad[facility]. Please contact [facility] Radiology at [phone] with questions or concerns regarding your invoice.   IF you received labwork today, you will receive an invoice from [facility]. Please contact [facility] at [phone] with questions or  concerns regarding your invoice.   Our billing staff will not be able to assist you with questions regarding bills from these companies.  You will be contacted with the lab results as soon as they are available. The fastest way to get your results is to activate your My Chart account. Instructions are located on the last page of this paperwork. If you have not heard from [place] regarding the results in 2 weeks, please contact this office.

## 2017-11-19 NOTE — Progress Notes (Signed)
PRIMARY CARE AT [facility] [address] [place] [address] [phone]  Date:  11/19/2017   Name:  Andrew Reeves   DOB:  [birth year]   MRN:  [id number]  PCP:  Andrew Bachelor, PA    History of Present Illness:  Andrew Reeves is a 52 y.o. male patient who presents to PCP with  Chief Complaint  Patient presents with  . 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  He is drinking diet sodas.  He is currently not exericisng, but states that he is walking at work.   He packs a meal when he can, but generally just eats whatever he wants.  No change in diet.   He eats a sausage mcmuffin and egg.  He is not fasting today.  No numbness or tingling, diarrhea, chest pains, palpitations, sob, tremulous, fatigue.   He takes the metformin compliantly, but takes the farxiga '5mg'$  once every other day.   HL: he has not taken the vascepa or the fenofibrate.  He is taking the omega fatty acid otc only.  He notes that he is compliant with this.   Patient Active Problem List   Diagnosis Date Noted  . Diabetes ([place]) 03/29/2017    Past Medical History:  Diagnosis Date  . Diabetes mellitus without complication ([place])    TYPE II    No past surgical history on file.  Social History   Tobacco Use  . Smoking status: Never Smoker  . Smokeless tobacco: Never Used  Substance Use Topics  . Alcohol use: No  . Drug use: No    Family History  Problem Relation Age of Onset  . Diabetes Mother   . Stroke Father   . Heart disease Father        HEART ATTACK    No Known Allergies  Medication list has been reviewed and updated.  Current Outpatient Medications on File Prior to Visit  Medication Sig Dispense Refill  . dapagliflozin propanediol (FARXIGA) 5 MG TABS tablet Take 5 mg by mouth daily. 30 tablet 0  . fenofibrate (TRICOR) 145 MG tablet Take 1 tablet (145 mg total) by mouth daily. 90 tablet 1  . metFORMIN (GLUCOPHAGE) 1000 MG  tablet Take 1 tablet (1,000 mg total) by mouth 2 (two) times daily with a meal. 180 tablet 3  . naproxen (NAPROSYN) 500 MG tablet Take 1 tablet (500 mg total) by mouth 2 (two) times daily as needed for mild pain, moderate pain or headache (TAKE WITH MEALS.). 30 tablet 1  . VASCEPA 1 g CAPS TAKE 1 CAPSULE BY MOUTH ONCE DAILY 90 capsule 2  . amoxicillin-clavulanate (AUGMENTIN) 875-125 MG tablet Take 1 tablet by mouth 2 (two) times daily. One po bid x 7 days (Patient not taking: Reported on 11/19/2017) 14 tablet 0  . HYDROcodone-acetaminophen (NORCO) 5-325 MG tablet Take 1 tablet by mouth every 6 (six) hours as needed for severe pain. (Patient not taking: Reported on 06/05/2017) 10 tablet 0   No current facility-administered medications on file prior to visit.     ROS ROS otherwise unremarkable unless listed above.  Physical Examination: BP (!) 153/96   Pulse 79   Temp 98.7 F (37.1 C) (Oral)   Resp 16   Ht '6\' 2"'$  (1.88 m)   Wt 285 lb (129.3 kg)   SpO2 98%   BMI 36.59 kg/m  Ideal Body Weight: Weight in (lb) to  have BMI = 25: 194.3 Wt Readings from Last 3 Encounters:  11/19/17 285 lb (129.3 kg)  06/05/17 278 lb (126.1 kg)  04/29/17 270 lb (122.5 kg)    Physical Exam  Constitutional: He is oriented to person, place, and time. He appears well-developed and well-nourished. No distress.  HENT:  Head: Normocephalic and atraumatic.  Eyes: Conjunctivae and EOM are normal. Pupils are equal, round, and reactive to light.  Cardiovascular: Normal rate.  Pulmonary/Chest: Effort normal. No respiratory distress.  Musculoskeletal: He exhibits no edema.  Neurological: He is alert and oriented to person, place, and time.  Skin: Skin is warm and dry. He is not diaphoretic.  Psychiatric: He has a normal mood and affect. His behavior is normal.   Results for orders placed or performed in visit on 11/19/17  POCT glycosylated hemoglobin (Hb A1C)  Result Value Ref Range   Hemoglobin A1C 9.5       Assessment and Plan: Andrew Reeves is a 52 y.o. male who is here today for cc of  Chief Complaint  Patient presents with  . Medication Refill    farxiga   a1c has improved some, but not as optimal given his non-compliance.  We have agreed to do the xigduo to increase compliance.  Given card to submit, for coverage of drug.  He has also agreed to eliminate bread, rice, pastas.  He will reduce his potato intake.   Follow up in 3 months.  Given 6 months ONLY today of xigduo. He will start pravastatin.     Type 2 diabetes mellitus with complication, without long-term current use of insulin (HCC) - Plan: CMP14+EGFR, POCT glycosylated hemoglobin (Hb A1C), pravastatin (PRAVACHOL) 20 MG tablet, Dapagliflozin-Metformin HCl ER (XIGDUO XR) 07-999 MG TB24, CANCELED: Lipid panel, CANCELED: Microalbumin, urine  Andrew Drape, PA-C [facility] 1/24/20191:25 PM

## 2017-11-19 NOTE — Telephone Encounter (Addendum)
Copied from [place]. Topic: Quick Communication - See Telephone Encounter >> Nov 19, 2017  3:10 PM [name] wrote: CRM for notification. See Telephone encounter for:  11/19/17. Patient want to make sure the labs that were taken today included LDL, HDL and the Triglyceride - Cholesterol and also the CBC.  He would like a call back today at [phone] or [phone]

## 2017-11-20 LAB — CMP14+EGFR
A/G RATIO: 1.8 (ref 1.2–2.2)
ALK PHOS: 76 IU/L (ref 39–117)
ALT: 32 IU/L (ref 0–44)
AST: 27 IU/L (ref 0–40)
Albumin: 4.5 g/dL (ref 3.5–5.5)
BILIRUBIN TOTAL: 0.5 mg/dL (ref 0.0–1.2)
BUN/Creatinine Ratio: 13 (ref 9–20)
BUN: 14 mg/dL (ref 6–24)
CO2: 23 mmol/L (ref 20–29)
Calcium: 9.2 mg/dL (ref 8.7–10.2)
Chloride: 102 mmol/L (ref 96–106)
Creatinine, Ser: 1.05 mg/dL (ref 0.76–1.27)
GFR calc Af Amer: 93 mL/min/{1.73_m2} (ref >59)
GFR calc non Af Amer: 80 mL/min/{1.73_m2} (ref >59)
GLOBULIN, TOTAL: 2.5 g/dL (ref 1.5–4.5)
Glucose: 184 mg/dL — ABNORMAL HIGH (ref 65–99)
POTASSIUM: 4.3 mmol/L (ref 3.5–5.2)
SODIUM: 141 mmol/L (ref 134–144)
Total Protein: 7 g/dL (ref 6.0–8.5)

## 2017-11-20 NOTE — Telephone Encounter (Signed)
Informed pt that he can come into the office and receive labs at anytime.

## 2017-11-23 ENCOUNTER — Telehealth: Payer: Self-pay

## 2017-11-23 ENCOUNTER — Ambulatory Visit: Payer: 59

## 2017-11-23 ENCOUNTER — Telehealth: Payer: Self-pay | Admitting: Physician Assistant

## 2017-11-23 DIAGNOSIS — E781 Pure hyperglyceridemia: Secondary | ICD-10-CM

## 2017-11-23 LAB — LIPID PANEL
CHOLESTEROL TOTAL: 178 mg/dL (ref 100–199)
Chol/HDL Ratio: 4.3 ratio (ref 0.0–5.0)
HDL: 41 mg/dL (ref >39)
LDL CALC: 85 mg/dL (ref 0–99)
TRIGLYCERIDES: 262 mg/dL — AB (ref 0–149)
VLDL CHOLESTEROL CAL: 52 mg/dL — AB (ref 5–40)

## 2017-11-23 NOTE — Telephone Encounter (Signed)
Copied from [place]. Topic: Quick Communication - See Telephone Encounter >> Nov 23, 2017  8:26 AM Robina Ade, Helene Kelp D wrote: CRM for notification. See Telephone encounter for: 11/23/17. Patient called to check on a med prior authorization for his Dapagliflozin-Metformin HCl ER (XIGDUO XR) 07-999 MG TB24. Please call patient back, thanks.

## 2017-11-23 NOTE — Telephone Encounter (Signed)
Per pt he needs prior auth on xigduo. Please advise pt once complete.

## 2017-11-23 NOTE — Telephone Encounter (Signed)
Spoke to patient and his wife.  Asked them to have pharmacy send a request for the xigduo.  Mrs. Mehlhoff said she would get it today.  She also requested that a CBC be done with patient's blood.  I called lab and spoke to [employer].  She will add on a CBC.  Called patient back and told them it had been added onto testing. They were very pleased.

## 2017-11-24 NOTE — Telephone Encounter (Signed)
Addressed in previous note, yesterday.

## 2017-12-29 ENCOUNTER — Other Ambulatory Visit: Payer: Self-pay | Admitting: Physician Assistant

## 2017-12-29 DIAGNOSIS — E118 Type 2 diabetes mellitus with unspecified complications: Secondary | ICD-10-CM

## 2017-12-30 ENCOUNTER — Telehealth: Payer: Self-pay | Admitting: Family Medicine

## 2017-12-30 NOTE — Telephone Encounter (Signed)
Called and left message to remind pt of their apt tomorrow. Advised of building number and time restrictions. °

## 2017-12-30 NOTE — Telephone Encounter (Signed)
LOV 12/23/17 with [name] / Refill request for Wilder Glade / Per last office note, I will send this to provider to verify if patient is still on this medication.

## 2017-12-31 ENCOUNTER — Other Ambulatory Visit: Payer: Self-pay

## 2017-12-31 ENCOUNTER — Ambulatory Visit (INDEPENDENT_AMBULATORY_CARE_PROVIDER_SITE_OTHER): Payer: 59 | Admitting: Family Medicine

## 2017-12-31 ENCOUNTER — Encounter: Payer: Self-pay | Admitting: Family Medicine

## 2017-12-31 VITALS — BP 138/84 | HR 81 | Temp 97.7°F | Ht 75.0 in | Wt 285.8 lb

## 2017-12-31 DIAGNOSIS — D1779 Benign lipomatous neoplasm of other sites: Secondary | ICD-10-CM

## 2017-12-31 DIAGNOSIS — E119 Type 2 diabetes mellitus without complications: Secondary | ICD-10-CM | POA: Diagnosis not present

## 2017-12-31 DIAGNOSIS — E781 Pure hyperglyceridemia: Secondary | ICD-10-CM | POA: Diagnosis not present

## 2017-12-31 MED ORDER — DAPAGLIFLOZIN PROPANEDIOL 5 MG PO TABS: 5.0000 mg | ORAL_TABLET | Freq: Every day | ORAL | 3 refills | Status: DC

## 2017-12-31 NOTE — Progress Notes (Signed)
3/7/201910:35 AM  Andrew Reeves 06-May-1966, 52 y.o. male [id number]  Chief Complaint  Patient presents with  . Follow-up    Diabetes follow up, needing medication refeill on farxiga 5mg . Refusal to Flu and Tetanus vaccines. Requesting referral for diabetic eye exam.    HPI:   Patient is a 52 y.o. male with past medical history significant for DM2 and dyslipidemia who presents today for followup on his chronic medical condiions  1. DM - He had been struggling with compliance, not really taking farxiga Was changed to combo pill bu too expensive Has been trying to improve compliance, he takes metformin AC breakfast and dinner Tries to take farxiga [facility] lunch but forgets often He does check his cbgs fasting, this am 115, which is normal for him He spends long hours driving, does lots of fast food, does not exercise He has recently moved and is planning on setting up his gym equipment up Otherwise requesting referral for ophtho, needs routine DM eye exam, denies any vision changes  2.Dyslipidemia - TG elevated at last visit. Statin started given DM, patient not interested in taking statins at this time, concerned about potential side effects. He has been taking fish oil  3. He is also requesting referral to gen surg for growing lipoma on back, starting to bother him when he sits against something or lies on his back.   Labs done Jan 2019 a1c 9.5 LDL 88, TG 262 crt 1.05, GFR 93  Depression screen [facility] 2/9 12/31/2017 06/05/2017 03/26/2017  Decreased Interest 0 0 0  Down, Depressed, Hopeless 0 0 0  PHQ - 2 Score 0 0 0    No Known Allergies  Prior to Admission medications   Medication Sig Start Date End Date Taking? Authorizing Provider  dapagliflozin propanediol (FARXIGA) 5 MG TABS tablet Take 5 mg by mouth daily. 08/24/17  Yes [name] D, PA  metFORMIN (GLUCOPHAGE) 1000 MG tablet Take 1 tablet (1,000 mg total) by mouth 2 (two) times daily with a meal. 03/27/17  Yes English,  Stephanie D, PA  naproxen (NAPROSYN) 500 MG tablet Take 1 tablet (500 mg total) by mouth 2 (two) times daily as needed for mild pain, moderate pain or headache (TAKE WITH MEALS.). _ TAKES PRN 06/05/17   Tenna Delaine D, PA-C  pravastatin (PRAVACHOL) 20 MG tablet Take 1 tablet (20 mg total) by mouth daily. - NOT TAKING 11/19/17   Joretta Bachelor, PA    Past Medical History:  Diagnosis Date  . Diabetes mellitus without complication ([place])    TYPE II    History reviewed. No pertinent surgical history.  Social History   Tobacco Use  . Smoking status: Never Smoker  . Smokeless tobacco: Never Used  Substance Use Topics  . Alcohol use: No    Family History  Problem Relation Age of Onset  . Diabetes Mother   . Stroke Father   . Heart disease Father        HEART ATTACK    Review of Systems  Constitutional: Negative for chills and fever.  Respiratory: Negative for cough and shortness of breath.   Cardiovascular: Negative for chest pain, palpitations and leg swelling.  Gastrointestinal: Negative for abdominal pain, nausea and vomiting.     OBJECTIVE:  Blood pressure 138/84, pulse 81, temperature 97.7 F (36.5 C), temperature source Oral, height 6\' 3"  (1.905 m), weight 285 lb 12.8 oz (129.6 kg), SpO2 98 %.  Physical Exam  Constitutional: He is oriented to person, place, and  time and well-developed, well-nourished, and in no distress.  HENT:  Head: Normocephalic and atraumatic.  Mouth/Throat: Oropharynx is clear and moist.  Eyes: EOM are normal. Pupils are equal, round, and reactive to light.  Neck: Neck supple.  Cardiovascular: Normal rate and regular rhythm. Exam reveals no gallop and no friction rub.  No murmur heard. Pulmonary/Chest: Effort normal and breath sounds normal. He has no wheezes. He has no rales.  Neurological: He is alert and oriented to person, place, and time. Gait normal.  Skin: Skin is warm and dry.  Large lipoma on left mid back, smaller lipoma on  right upper back     ASSESSMENT and PLAN  1. Type 2 diabetes mellitus without complication, without long-term current use of insulin (HCC) - dapagliflozin propanediol (FARXIGA) 5 MG TABS tablet; Take 5 mg by mouth daily. - Ambulatory referral to Ophthalmology Not controlled. Next a1c not due until end of this month Discussed taking farxiga in am when he takes his metformin to improve compliance Discussed ways to modify fast food, patient with some hesitancy about sign diet changes, will start with ordering diet sodas and small fries.  Patient feels confident in being able to exercise once gym equipment is up as he enjoys doing so   2. Hypertriglyceridemia Patient taking fish oil, should also improve as cbgs improved Discussed diet and exercise Discussed statin mostly due to DM, patient declines at this time  3. Lipoma of other specified sites - Ambulatory referral to General Surgery  Return in about 4 weeks (around 01/28/2018).    Rutherford Guys, MD Primary Care at [place], [place] [address] Ph.  [phone] Fax [phone]

## 2017-12-31 NOTE — Patient Instructions (Addendum)
1. Take farxiga in morning with metformin 2. Lets try to limit carbs, such as ordering small instead of large fries    IF you received an x-ray today, you will receive an invoice from [facility] Radiology. Please contact [facility] Radiology at [phone] with questions or concerns regarding your invoice.   IF you received labwork today, you will receive an invoice from [facility]. Please contact [facility] at [phone] with questions or concerns regarding your invoice.   Our billing staff will not be able to assist you with questions regarding bills from these companies.  You will be contacted with the lab results as soon as they are available. The fastest way to get your results is to activate your My Chart account. Instructions are located on the last page of this paperwork. If you have not heard from [place] regarding the results in 2 weeks, please contact this office.

## 2018-01-01 DIAGNOSIS — E781 Pure hyperglyceridemia: Secondary | ICD-10-CM | POA: Insufficient documentation

## 2018-01-12 ENCOUNTER — Telehealth: Payer: Self-pay | Admitting: Physician Assistant

## 2018-01-12 NOTE — Telephone Encounter (Signed)
Called pt to try and reschedule his appt that he has scheduled with [name] on 4/25. She will not be in the office that day so when pt calls back, please schedule him for a different provider or a different day.  Thanks!

## 2018-01-28 ENCOUNTER — Encounter: Payer: Self-pay | Admitting: Family Medicine

## 2018-01-28 ENCOUNTER — Other Ambulatory Visit: Payer: Self-pay

## 2018-01-28 ENCOUNTER — Ambulatory Visit (INDEPENDENT_AMBULATORY_CARE_PROVIDER_SITE_OTHER): Payer: 59 | Admitting: Family Medicine

## 2018-01-28 VITALS — BP 126/80 | HR 76 | Temp 98.5°F | Ht 74.0 in | Wt 275.0 lb

## 2018-01-28 DIAGNOSIS — E119 Type 2 diabetes mellitus without complications: Secondary | ICD-10-CM | POA: Diagnosis not present

## 2018-01-28 DIAGNOSIS — E785 Hyperlipidemia, unspecified: Secondary | ICD-10-CM | POA: Diagnosis not present

## 2018-01-28 DIAGNOSIS — Z119 Encounter for screening for infectious and parasitic diseases, unspecified: Secondary | ICD-10-CM | POA: Diagnosis not present

## 2018-01-28 LAB — POCT GLYCOSYLATED HEMOGLOBIN (HGB A1C): Hemoglobin A1C: 8.6

## 2018-01-28 MED ORDER — PRAVASTATIN SODIUM 20 MG PO TABS: 20.0000 mg | ORAL_TABLET | Freq: Every day | ORAL | 3 refills | Status: DC

## 2018-01-28 MED ORDER — METFORMIN HCL 1000 MG PO TABS: 1000.0000 mg | ORAL_TABLET | Freq: Two times a day (BID) | ORAL | 3 refills | Status: DC

## 2018-01-28 MED ORDER — DAPAGLIFLOZIN PROPANEDIOL 5 MG PO TABS: 5.0000 mg | ORAL_TABLET | Freq: Every day | ORAL | 3 refills | Status: DC

## 2018-01-28 NOTE — Patient Instructions (Addendum)
[place], [place], [place] [address] [phone]  Your A1c today was 8.5   IF you received an x-ray today, you will receive an invoice from [facility] Radiology. Please contact [facility] Radiology at [phone] with questions or concerns regarding your invoice.   IF you received labwork today, you will receive an invoice from [facility]. Please contact [facility] at [phone] with questions or concerns regarding your invoice.   Our billing staff will not be able to assist you with questions regarding bills from these companies.  You will be contacted with the lab results as soon as they are available. The fastest way to get your results is to activate your My Chart account. Instructions are located on the last page of this paperwork. If you have not heard from [place] regarding the results in 2 weeks, please contact this office.

## 2018-01-28 NOTE — Progress Notes (Signed)
4/4/20198:41 AM  Andrew Reeves 1966/01/16, 52 y.o. male [id number]  Chief Complaint  Patient presents with  . Follow-up    Type 2 diabetes follow up     HPI:   Patient is a 52 y.o. male with past medical history significant for DM2 and HLP who presents today for followup  Patient reports overall doing well He has had no more compliance issues with taking meds as prescribed now that he takes his farxiga with morning metformin and pravastatin with dinner metformin He has been watching what he eats, cutting back on fast foods, smaller portions He set up with elliptical machine this week and got on it for the first time yesterday He denies any lows He cont to check fasting cbgs, reports at goal < 130 He has not scheduled ophtho appt yet, but did hear from them He has not heard form gen surg yet He has no acute concerns today  Depression screen [facility] 2/9 01/28/2018 12/31/2017 06/05/2017  Decreased Interest 0 0 0  Down, Depressed, Hopeless 0 0 0  PHQ - 2 Score 0 0 0    No Known Allergies  Prior to Admission medications   Medication Sig Start Date End Date Taking? Authorizing Provider  dapagliflozin propanediol (FARXIGA) 5 MG TABS tablet Take 5 mg by mouth daily. 12/31/17  Yes Rutherford Guys, MD  FARXIGA 5 MG TABS tablet TAKE 1 TABLET BY MOUTH ONCE DAILY 01/02/18  Yes [name] D, PA  metFORMIN (GLUCOPHAGE) 1000 MG tablet Take 1 tablet (1,000 mg total) by mouth 2 (two) times daily with a meal. 03/27/17  Yes English, Stephanie D, PA  naproxen (NAPROSYN) 500 MG tablet Take 1 tablet (500 mg total) by mouth 2 (two) times daily as needed for mild pain, moderate pain or headache (TAKE WITH MEALS.). 06/05/17  Yes Timmothy Euler, [place] D, PA-C  pravastatin (PRAVACHOL) 20 MG tablet Take 1 tablet (20 mg total) by mouth daily. 11/19/17  Yes Joretta Bachelor, PA    Past Medical History:  Diagnosis Date  . Diabetes mellitus without complication ([place])    TYPE II    History reviewed. No  pertinent surgical history.  Social History   Tobacco Use  . Smoking status: Never Smoker  . Smokeless tobacco: Never Used  Substance Use Topics  . Alcohol use: No    Family History  Problem Relation Age of Onset  . Diabetes Mother   . Stroke Father   . Heart disease Father        HEART ATTACK    Review of Systems  Constitutional: Negative for chills and fever.  Respiratory: Negative for cough and shortness of breath.   Cardiovascular: Negative for chest pain, palpitations and leg swelling.  Gastrointestinal: Negative for abdominal pain, nausea and vomiting.     OBJECTIVE:  Blood pressure 126/80, pulse 76, temperature 98.5 F (36.9 C), temperature source Oral, height 6\' 2"  (1.88 m), weight 275 lb (124.7 kg), SpO2 97 %.  Wt Readings from Last 3 Encounters:  01/28/18 275 lb (124.7 kg)  12/31/17 285 lb 12.8 oz (129.6 kg)  11/19/17 285 lb (129.3 kg)    Physical Exam  Constitutional: He is oriented to person, place, and time and well-developed, well-nourished, and in no distress.  HENT:  Head: Normocephalic and atraumatic.  Mouth/Throat: Oropharynx is clear and moist.  Eyes: Pupils are equal, round, and reactive to light. EOM are normal.  Neck: Neck supple.  Cardiovascular: Normal rate and regular rhythm. Exam reveals no gallop and  no friction rub.  No murmur heard. Pulmonary/Chest: Effort normal and breath sounds normal. He has no wheezes. He has no rales.  Neurological: He is alert and oriented to person, place, and time. Gait normal.  Skin: Skin is warm and dry.     Today's hgb A1c 8.5 No results found for this or any previous visit (from the past 24 hour(s)).  ASSESSMENT and PLAN  1. Type 2 diabetes mellitus without complication, without long-term current use of insulin ([place]) Congratulated patient on LFM, improved a1c. Continue with current regime. Encouraged scheduling of eye exam and pneumoccocal vaccine.  - POCT glycosylated hemoglobin (Hb A1C) -  Comprehensive metabolic panel - Lipid panel - dapagliflozin propanediol (FARXIGA) 5 MG TABS tablet; Take 5 mg by mouth daily.  2. Hyperlipidemia, unspecified hyperlipidemia type Rechecking today. Continue current regime.  - pravastatin (PRAVACHOL) 20 MG tablet; Take 1 tablet (20 mg total) by mouth daily.  3. Screening examination for infectious disease - HCV Ab w/Rflx to Verification - HIV antibody  Other orders - metFORMIN (GLUCOPHAGE) 1000 MG tablet; Take 1 tablet (1,000 mg total) by mouth 2 (two) times daily with a meal.  Return in about 3 months (around 04/29/2018).    Rutherford Guys, MD Primary Care at [facility], [place] [address] Ph.  [phone] Fax [phone]

## 2018-01-29 LAB — LIPID PANEL
Chol/HDL Ratio: 5.3 ratio — ABNORMAL HIGH (ref 0.0–5.0)
Cholesterol, Total: 189 mg/dL (ref 100–199)
HDL: 36 mg/dL — ABNORMAL LOW (ref >39)
LDL Calculated: 91 mg/dL (ref 0–99)
Triglycerides: 309 mg/dL — ABNORMAL HIGH (ref 0–149)
VLDL Cholesterol Cal: 62 mg/dL — ABNORMAL HIGH (ref 5–40)

## 2018-01-29 LAB — COMPREHENSIVE METABOLIC PANEL
ALT: 31 IU/L (ref 0–44)
AST: 25 IU/L (ref 0–40)
Albumin/Globulin Ratio: 1.7 (ref 1.2–2.2)
Albumin: 4.3 g/dL (ref 3.5–5.5)
Alkaline Phosphatase: 72 IU/L (ref 39–117)
BUN/Creatinine Ratio: 9 (ref 9–20)
BUN: 10 mg/dL (ref 6–24)
Bilirubin Total: 0.5 mg/dL (ref 0.0–1.2)
CO2: 20 mmol/L (ref 20–29)
Calcium: 9.2 mg/dL (ref 8.7–10.2)
Chloride: 102 mmol/L (ref 96–106)
Creatinine, Ser: 1.1 mg/dL (ref 0.76–1.27)
GFR calc Af Amer: 88 mL/min/{1.73_m2} (ref >59)
GFR calc non Af Amer: 76 mL/min/{1.73_m2} (ref >59)
Globulin, Total: 2.6 g/dL (ref 1.5–4.5)
Glucose: 155 mg/dL — ABNORMAL HIGH (ref 65–99)
Potassium: 4.4 mmol/L (ref 3.5–5.2)
Sodium: 140 mmol/L (ref 134–144)
Total Protein: 6.9 g/dL (ref 6.0–8.5)

## 2018-01-29 LAB — HCV INTERPRETATION

## 2018-01-29 LAB — HCV AB W/RFLX TO VERIFICATION: HCV Ab: <0.1 s/co ratio (ref 0.0–0.9)

## 2018-01-29 LAB — HIV ANTIBODY (ROUTINE TESTING W REFLEX): HIV Screen 4th Generation wRfx: NONREACTIVE

## 2018-02-03 ENCOUNTER — Encounter: Payer: Self-pay | Admitting: Physician Assistant

## 2018-02-18 ENCOUNTER — Ambulatory Visit: Payer: Self-pay | Admitting: Physician Assistant

## 2018-04-27 ENCOUNTER — Telehealth: Payer: Self-pay | Admitting: Family Medicine

## 2018-04-27 NOTE — Telephone Encounter (Signed)
Called Andrew Reeves to try and reschedule his appt with Dr. Pamella Pert on 05/06/18. She will be unavailable that day. When Andrew Reeves calls back, please reschedule him for a different day with her at his convenience for a 3 month f/u  Thanks!

## 2018-05-06 ENCOUNTER — Ambulatory Visit: Payer: Self-pay | Admitting: Family Medicine

## 2018-06-17 ENCOUNTER — Ambulatory Visit: Payer: Self-pay | Admitting: Family Medicine

## 2018-07-05 ENCOUNTER — Ambulatory Visit: Payer: Self-pay | Admitting: Family Medicine

## 2018-07-15 ENCOUNTER — Encounter: Payer: Self-pay | Admitting: Family Medicine

## 2018-07-15 ENCOUNTER — Ambulatory Visit (INDEPENDENT_AMBULATORY_CARE_PROVIDER_SITE_OTHER): Payer: 59 | Admitting: Family Medicine

## 2018-07-15 ENCOUNTER — Other Ambulatory Visit: Payer: Self-pay

## 2018-07-15 VITALS — BP 112/84 | HR 74 | Temp 97.8°F | Ht 74.0 in | Wt 273.0 lb

## 2018-07-15 DIAGNOSIS — E119 Type 2 diabetes mellitus without complications: Secondary | ICD-10-CM | POA: Diagnosis not present

## 2018-07-15 DIAGNOSIS — E785 Hyperlipidemia, unspecified: Secondary | ICD-10-CM | POA: Diagnosis not present

## 2018-07-15 LAB — CMP14+EGFR
ALT: 30 IU/L (ref 0–44)
AST: 27 IU/L (ref 0–40)
Albumin/Globulin Ratio: 2 (ref 1.2–2.2)
Albumin: 4.5 g/dL (ref 3.5–5.5)
Alkaline Phosphatase: 77 IU/L (ref 39–117)
BUN/Creatinine Ratio: 13 (ref 9–20)
BUN: 14 mg/dL (ref 6–24)
Bilirubin Total: 0.6 mg/dL (ref 0.0–1.2)
CO2: 22 mmol/L (ref 20–29)
Calcium: 9.2 mg/dL (ref 8.7–10.2)
Chloride: 101 mmol/L (ref 96–106)
Creatinine, Ser: 1.07 mg/dL (ref 0.76–1.27)
GFR calc Af Amer: 90 mL/min/{1.73_m2} (ref >59)
GFR calc non Af Amer: 78 mL/min/{1.73_m2} (ref >59)
Globulin, Total: 2.3 g/dL (ref 1.5–4.5)
Glucose: 178 mg/dL — ABNORMAL HIGH (ref 65–99)
Potassium: 4.4 mmol/L (ref 3.5–5.2)
Sodium: 137 mmol/L (ref 134–144)
Total Protein: 6.8 g/dL (ref 6.0–8.5)

## 2018-07-15 LAB — LIPID PANEL
Chol/HDL Ratio: 4.6 ratio (ref 0.0–5.0)
Cholesterol, Total: 174 mg/dL (ref 100–199)
HDL: 38 mg/dL — ABNORMAL LOW (ref >39)
LDL Calculated: 59 mg/dL (ref 0–99)
Triglycerides: 385 mg/dL — ABNORMAL HIGH (ref 0–149)
VLDL Cholesterol Cal: 77 mg/dL — ABNORMAL HIGH (ref 5–40)

## 2018-07-15 LAB — POCT GLYCOSYLATED HEMOGLOBIN (HGB A1C): Hemoglobin A1C: 9.2 % — AB (ref 4.0–5.6)

## 2018-07-15 MED ORDER — DAPAGLIFLOZIN PROPANEDIOL 10 MG PO TABS: 10.0000 mg | ORAL_TABLET | Freq: Every day | ORAL | 3 refills | Status: DC

## 2018-07-15 MED ORDER — METFORMIN HCL 1000 MG PO TABS: 1000.0000 mg | ORAL_TABLET | Freq: Two times a day (BID) | ORAL | 3 refills | Status: DC

## 2018-07-15 MED ORDER — ATORVASTATIN CALCIUM 20 MG PO TABS: 20.0000 mg | ORAL_TABLET | Freq: Every day | ORAL | 3 refills | Status: DC

## 2018-07-15 NOTE — Progress Notes (Signed)
9/19/20199:05 AM  Mikel Cella 04-Nov-1965, 52 y.o. male [id number]  Chief Complaint  Patient presents with  . Diabetes    3 month chk for chronic conditions, needs refill on Farxiga 24m  . Hyperlipidemia    HPI:   Patient is a 52y.o. male with past medical history significant for DM2 and HLP who presents today for routine followup  Overall doing well Reports taking all as prescribed except for pravastatin, not aware he was supposed to take Checks fasting daily, ~ 140 Sometimes feels that he has lows, feels sleepy, tired, takes a peppermint, does not tend to check cbg during that time Has not been as consistent on exercise nor diet Needs referral for eye exam, he does wear eye glasses, last visit in 2017 Declines flu vaccine  Fall Risk  07/15/2018 01/28/2018 12/31/2017 06/05/2017 03/26/2017  Falls in the past year? _0      [facility] 07/15/2018 01/28/2018 12/31/2017  Decreased Interest 0 0 0  Down, Depressed, Hopeless 0 0 0  PHQ - 2 Score 0 0 0    No Known Allergies  Prior to Admission medications   Medication Sig Start Date End Date Taking? Authorizing Provider  dapagliflozin propanediol (FARXIGA) 5 MG TABS tablet Take 5 mg by mouth daily. 01/28/18  Yes SRutherford Guys MD  metFORMIN (GLUCOPHAGE) 1000 MG tablet Take 1 tablet (1,000 mg total) by mouth 2 (two) times daily with a meal. 01/28/18  Yes SRutherford Guys MD  naproxen (NAPROSYN) 500 MG tablet Take 1 tablet (500 mg total) by mouth 2 (two) times daily as needed for mild pain, moderate pain or headache (TAKE WITH MEALS.). 06/05/17  Yes WTimmothy Euler BT[place]D, PA-C  pravastatin (PRAVACHOL) 20 MG tablet Take 1 tablet (20 mg total) by mouth daily. 01/28/18  Yes SRutherford Guys MD    Past Medical History:  Diagnosis Date  . [place]    TYPE II    History reviewed. No pertinent surgical history.  Social History   Tobacco Use  . Smoking status: Never Smoker  .  Smokeless tobacco: Never Used  Substance Use Topics  . Alcohol use: No    Family History  Problem Relation Age of Onset  . Diabetes Mother   . Stroke Father   . Heart disease Father        HEART ATTACK    Review of Systems  Constitutional: Negative for chills and fever.  Respiratory: Negative for cough and shortness of breath.   Cardiovascular: Negative for chest pain, palpitations and leg swelling.  Gastrointestinal: Negative for abdominal pain, nausea and vomiting.     OBJECTIVE:  Blood pressure 112/84, pulse 74, temperature 97.8 F (36.6 C), temperature source Oral, height _1  (1.88 m), weight 273 lb (123.8 kg), SpO2 97 %. Body mass index is 35.05 kg/m.   Wt Readings from Last 3 Encounters:  07/15/18 273 lb (123.8 kg)  01/28/18 275 lb (124.7 kg)  12/31/17 285 lb 12.8 oz (129.6 kg)    Physical Exam  Constitutional: He is oriented to person, place, and time. He appears well-developed and well-nourished.  HENT:  Head: Normocephalic and atraumatic.  Mouth/Throat: Oropharynx is clear and moist.  Eyes: Pupils are equal, round, and reactive to light. Conjunctivae and EOM are normal.  Neck: Neck supple.  Cardiovascular: Normal rate and regular rhythm. Exam reveals no gallop and no friction rub.  No murmur heard. Pulmonary/Chest: Effort normal and breath sounds normal.  He has no wheezes. He has no rales.  Musculoskeletal: He exhibits no edema.  Neurological: He is alert and oriented to person, place, and time.  Skin: Skin is warm and dry.  Psychiatric: He has a normal mood and affect.  Nursing note and vitals reviewed.   Results for orders placed or performed in visit on 07/15/18 (from the past 24 hour(s))  POCT glycosylated hemoglobin (Hb A1C)     Status: Abnormal   Collection Time: 07/15/18  9:03 AM  Result Value Ref Range   Hemoglobin A1C 9.2 (A) 4.0 - 5.6 %   HbA1c POC (<> result, manual entry)     HbA1c, POC (prediabetic range)     HbA1c, POC (controlled  diabetic range)    previous a1c 8.6  Diabetic Foot Exam - Simple   Simple Foot Form Visual Inspection No deformities, no ulcerations, no other skin breakdown bilaterally:  Yes Sensation Testing Intact to touch and monofilament testing bilaterally:  Yes Pulse Check Comments Felt all pricks with the filament on both feet.     ASSESSMENT and PLAN  1. Type 2 diabetes mellitus without complication, without long-term current use of insulin (HCC) Worse. Increasing farxiga. More than 50% of this 25 min visit was spent discussing importance of low carb diet, regular exercise and healthy weight.  - CMP14+EGFR - Lipid panel - POCT glycosylated hemoglobin (Hb A1C) - Microalbumin/Creatinine Ratio, Urine - HM DIABETES FOOT EXAM - Ambulatory referral to Ophthalmology - Care order/instruction:  2. Hyperlipidemia, unspecified hyperlipidemia type Discussed with patient treatment recommendations to be on statin if DM, r/se/b. Patient agreed to trial.  Other orders - atorvastatin (LIPITOR) 20 MG tablet; Take 1 tablet (20 mg total) by mouth daily. - dapagliflozin propanediol (FARXIGA) 10 MG TABS tablet; Take 10 mg by mouth daily. - metFORMIN (GLUCOPHAGE) 1000 MG tablet; Take 1 tablet (1,000 mg total) by mouth 2 (two) times daily with a meal.    Return in about 3 months (around 10/14/2018).    Rutherford Guys, MD Primary Care at [facility], [place] [address] Ph.  [phone] Fax [phone]

## 2018-07-15 NOTE — Patient Instructions (Addendum)
If you have lab work done today you will be contacted with your lab results within the next 2 weeks.  If you have not heard from [place] then please contact us. The fastest way to get your results is to register for My Chart.   IF you received an x-ray today, you will receive an invoice from [facility]. Please contact [facility] at [phone] with questions or concerns regarding your invoice.   IF you received labwork today, you will receive an invoice from [place]. Please contact [facility] at [phone] with questions or concerns regarding your invoice.   Our billing staff will not be able to assist you with questions regarding bills from these companies.  You will be contacted with the lab results as soon as they are available. The fastest way to get your results is to activate your My Chart account. Instructions are located on the last page of this paperwork. If you have not heard from [place] regarding the results in 2 weeks, please contact this office.     Diabetes Mellitus and Exercise Exercising regularly is important for your overall health, especially when you have diabetes (diabetes mellitus). Exercising is not only about losing weight. It has many health benefits, such as increasing muscle strength and bone density and reducing body fat and stress. This leads to improved fitness, flexibility, and endurance, all of which result in better overall health. Exercise has additional benefits for people with diabetes, including:  Reducing appetite.  Helping to lower and control blood glucose.  Lowering blood pressure.  Helping to control amounts of fatty substances (lipids) in the blood, such as cholesterol and triglycerides.  Helping the body to respond better to insulin (improving insulin sensitivity).  Reducing how much insulin the body needs.  Decreasing the risk for heart disease by: ? Lowering cholesterol and triglyceride levels. ? Increasing the levels  of good cholesterol. ? Lowering blood glucose levels.  What is my activity plan? Your health care provider or certified diabetes educator can help you make a plan for the type and frequency of exercise (activity plan) that works for you. Make sure that you:  Do at least 150 minutes of moderate-intensity or vigorous-intensity exercise each week. This could be brisk walking, biking, or water aerobics. ? Do stretching and strength exercises, such as yoga or weightlifting, at least 2 times a week. ? Spread out your activity over at least 3 days of the week.  Get some form of physical activity every day. ? Do not go more than 2 days in a row without some kind of physical activity. ? Avoid being inactive for more than 90 minutes at a time. Take frequent breaks to walk or stretch.  Choose a type of exercise or activity that you enjoy, and set realistic goals.  Start slowly, and gradually increase the intensity of your exercise over time.  What do I need to know about managing my diabetes?  Check your blood glucose before and after exercising. ? If your blood glucose is higher than 240 mg/dL (13.3 mmol/L) before you exercise, check your urine for ketones. If you have ketones in your urine, do not exercise until your blood glucose returns to normal.  Know the symptoms of low blood glucose (hypoglycemia) and how to treat it. Your risk for hypoglycemia increases during and after exercise. Common symptoms of hypoglycemia can include: ? Hunger. ? Anxiety. ? Sweating and feeling clammy. ? Confusion. ? Dizziness or feeling light-headed. ? Increased heart rate or  palpitations. ? Blurry vision. ? Tingling or numbness around the mouth, lips, or tongue. ? Tremors or shakes. ? Irritability.  Keep a rapid-acting carbohydrate snack available before, during, and after exercise to help prevent or treat hypoglycemia.  Avoid injecting insulin into areas of the body that are going to be exercised. For  example, avoid injecting insulin into: ? The arms, when playing tennis. ? The legs, when jogging.  Keep records of your exercise habits. Doing this can help you and your health care provider adjust your diabetes management plan as needed. Write down: ? Food that you eat before and after you exercise. ? Blood glucose levels before and after you exercise. ? The type and amount of exercise you have done. ? When your insulin is expected to peak, if you use insulin. Avoid exercising at times when your insulin is peaking.  When you start a new exercise or activity, work with your health care provider to make sure the activity is safe for you, and to adjust your insulin, medicines, or food intake as needed.  Drink plenty of water while you exercise to prevent dehydration or heat stroke. Drink enough fluid to keep your urine clear or pale yellow. This information is not intended to replace advice given to you by your health care provider. Make sure you discuss any questions you have with your health care provider. Document Released: 01/03/2004 Document Revised: 05/02/2016 Document Reviewed: 03/24/2016 Elsevier Interactive Patient Education  2018 [employer].

## 2018-07-16 LAB — MICROALBUMIN / CREATININE URINE RATIO
Creatinine, Urine: 131.9 mg/dL
Microalb/Creat Ratio: 28.3 mg/g creat (ref 0.0–30.0)
Microalbumin, Urine: 37.3 ug/mL

## 2018-10-07 ENCOUNTER — Ambulatory Visit: Payer: Self-pay | Admitting: Family Medicine

## 2018-10-13 ENCOUNTER — Ambulatory Visit: Payer: Self-pay | Admitting: Family Medicine

## 2018-10-18 ENCOUNTER — Telehealth: Payer: Self-pay | Admitting: Family Medicine

## 2018-10-18 ENCOUNTER — Encounter (HOSPITAL_COMMUNITY): Payer: Self-pay

## 2018-10-18 ENCOUNTER — Emergency Department (HOSPITAL_COMMUNITY)
Admission: EM | Admit: 2018-10-18 | Discharge: 2018-10-18 | Disposition: A | Discharge status: Home / Self Care | Payer: No Typology Code available for payment source | Attending: Emergency Medicine | Admitting: Emergency Medicine

## 2018-10-18 ENCOUNTER — Emergency Department (HOSPITAL_COMMUNITY): Payer: No Typology Code available for payment source

## 2018-10-18 DIAGNOSIS — M542 Cervicalgia: Secondary | ICD-10-CM | POA: Diagnosis not present

## 2018-10-18 DIAGNOSIS — Y9389 Activity, other specified: Secondary | ICD-10-CM | POA: Insufficient documentation

## 2018-10-18 DIAGNOSIS — R410 Disorientation, unspecified: Secondary | ICD-10-CM | POA: Insufficient documentation

## 2018-10-18 DIAGNOSIS — Z23 Encounter for immunization: Secondary | ICD-10-CM | POA: Diagnosis not present

## 2018-10-18 DIAGNOSIS — S0083XA Contusion of other part of head, initial encounter: Secondary | ICD-10-CM | POA: Diagnosis not present

## 2018-10-18 DIAGNOSIS — R51 Headache: Secondary | ICD-10-CM | POA: Diagnosis not present

## 2018-10-18 DIAGNOSIS — H5789 Other specified disorders of eye and adnexa: Secondary | ICD-10-CM | POA: Diagnosis not present

## 2018-10-18 DIAGNOSIS — E119 Type 2 diabetes mellitus without complications: Secondary | ICD-10-CM | POA: Diagnosis not present

## 2018-10-18 DIAGNOSIS — S0993XA Unspecified injury of face, initial encounter: Secondary | ICD-10-CM | POA: Diagnosis present

## 2018-10-18 DIAGNOSIS — Y9289 Other specified places as the place of occurrence of the external cause: Secondary | ICD-10-CM | POA: Diagnosis not present

## 2018-10-18 DIAGNOSIS — Y990 Civilian activity done for income or pay: Secondary | ICD-10-CM | POA: Insufficient documentation

## 2018-10-18 DIAGNOSIS — S60413A Abrasion of left middle finger, initial encounter: Secondary | ICD-10-CM | POA: Insufficient documentation

## 2018-10-18 MED ORDER — FLUORESCEIN SODIUM 1 MG OP STRP
1.0000 | ORAL_STRIP | Freq: Once | OPHTHALMIC | Status: AC
  Administered 2018-10-18: 1 via OPHTHALMIC
  Filled 2018-10-18: qty 1

## 2018-10-18 MED ORDER — AMOXICILLIN-POT CLAVULANATE 875-125 MG PO TABS: 1.0000 | ORAL_TABLET | Freq: Two times a day (BID) | ORAL | 0 refills | Status: DC

## 2018-10-18 MED ORDER — TETRACAINE HCL 0.5 % OP SOLN
1.0000 [drp] | Freq: Once | OPHTHALMIC | Status: AC
  Administered 2018-10-18: 1 [drp] via OPHTHALMIC
  Filled 2018-10-18: qty 4

## 2018-10-18 MED ORDER — HYDROCODONE-ACETAMINOPHEN 5-325 MG PO TABS
1.0000 | ORAL_TABLET | Freq: Once | ORAL | Status: AC
  Administered 2018-10-18: 1 via ORAL
  Filled 2018-10-18: qty 1

## 2018-10-18 MED ORDER — TETANUS-DIPHTH-ACELL PERTUSSIS 5-2.5-18.5 LF-MCG/0.5 IM SUSP
0.5000 mL | Freq: Once | INTRAMUSCULAR | Status: AC
  Administered 2018-10-18: 0.5 mL via INTRAMUSCULAR
  Filled 2018-10-18: qty 0.5

## 2018-10-18 NOTE — Telephone Encounter (Signed)
Copied from [facility] [id number]. Topic: Quick Communication - Rx Refill/Question >> Oct 18, 2018  4:28 PM Blase Mess A wrote: Medication: dapagliflozin propanediol (FARXIGA) 10 MG TABS tablet [[id number]]   Has the patient contacted their pharmacy? Yes  (Agent: If no, request that the patient contact the pharmacy for the refill.) (Agent: If yes, when and what did the pharmacy advise?)  Preferred Pharmacy (with phone number or street name): [place], [place] [phone] (Phone) [phone] (Fax)    Agent: Please be advised that RX refills may take up to 3 business days. We ask that you follow-up with your pharmacy.
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
## 2018-10-18 NOTE — Telephone Encounter (Signed)
Spoke with Oswaldo Milian, at [facility] who states the pt has refills on file and can be filled for the pt. Oswaldo Milian states that the last refill was for #20 and she will she what can be filled for the pt.
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
## 2018-10-24 ENCOUNTER — Encounter (HOSPITAL_COMMUNITY): Payer: Self-pay | Admitting: Emergency Medicine

## 2018-10-24 ENCOUNTER — Emergency Department (HOSPITAL_COMMUNITY)
Admission: EM | Admit: 2018-10-24 | Discharge: 2018-10-24 | Disposition: A | Discharge status: Home / Self Care | Payer: No Typology Code available for payment source | Attending: Emergency Medicine | Admitting: Emergency Medicine

## 2018-10-24 DIAGNOSIS — S0990XD Unspecified injury of head, subsequent encounter: Secondary | ICD-10-CM | POA: Insufficient documentation

## 2018-10-24 DIAGNOSIS — Z79899 Other long term (current) drug therapy: Secondary | ICD-10-CM | POA: Diagnosis not present

## 2018-10-24 DIAGNOSIS — F0781 Postconcussional syndrome: Secondary | ICD-10-CM | POA: Insufficient documentation

## 2018-10-24 DIAGNOSIS — Z7984 Long term (current) use of oral hypoglycemic drugs: Secondary | ICD-10-CM | POA: Insufficient documentation

## 2018-10-24 DIAGNOSIS — E119 Type 2 diabetes mellitus without complications: Secondary | ICD-10-CM | POA: Diagnosis not present

## 2018-10-24 DIAGNOSIS — H9319 Tinnitus, unspecified ear: Secondary | ICD-10-CM | POA: Diagnosis present

## 2018-10-24 MED ORDER — MECLIZINE HCL 25 MG PO TABS
25.0000 mg | ORAL_TABLET | Freq: Once | ORAL | Status: AC
  Administered 2018-10-24: 25 mg via ORAL
  Filled 2018-10-24: qty 1

## 2018-10-24 MED ORDER — MECLIZINE HCL 25 MG PO TABS: 25.0000 mg | ORAL_TABLET | Freq: Three times a day (TID) | ORAL | 0 refills | Status: DC | PRN

## 2018-10-24 MED ORDER — ONDANSETRON HCL 4 MG PO TABS: 4.0000 mg | ORAL_TABLET | Freq: Four times a day (QID) | ORAL | 0 refills | Status: DC

## 2018-10-24 MED ORDER — ONDANSETRON 4 MG PO TBDP
4.0000 mg | ORAL_TABLET | Freq: Once | ORAL | Status: AC
  Administered 2018-10-24: 4 mg via ORAL
  Filled 2018-10-24: qty 1

## 2018-10-24 NOTE — ED Notes (Signed)
Bed: WTR8 Expected date:  Expected time:  Means of arrival:  Comments: 

## 2018-10-24 NOTE — Discharge Instructions (Signed)
You can take Tylenol or Ibuprofen as directed for pain. You can alternate Tylenol and Ibuprofen every 4 hours. If you take Tylenol at 1pm, then you can take Ibuprofen at 5pm. Then you can take Tylenol again at 9pm.   Take meclizine as directed.  Take Zofran for nausea/vomiting.  As we discussed, you should be engaging in rest.  Make sure you are getting plenty of sleep.  Additionally, limit the amount of screen time (TV, phone, computer) that you are exposed to.  You can follow-up with referred concussion clinic for further evaluation.  Follow-up with your primary care doctor as previously scheduled.  Return the emergency department for any chest pain, difficulty breathing, unable to walk, difficulty moving your arms or legs, persistent vomiting or any other worsening or concerning symptoms.
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
## 2018-10-26 ENCOUNTER — Ambulatory Visit: Payer: Self-pay | Admitting: Family Medicine

## 2018-10-26 ENCOUNTER — Telehealth: Payer: Self-pay | Admitting: Family Medicine

## 2018-10-26 NOTE — Telephone Encounter (Signed)
Called and spoke with pt's wife (per DPR) advising that Dr. Pamella Pert is out of the office sick today. Per Wilfred Curtis, I also advised that the pt was here for a Worker's Comp ED follow up and we don't provide Worker's Comp visits. Pt's wife said she knew that and wasn't sure why the appointment was made anyway. I advised of pt's next appt ans well as their daughter's next appt. I advised of time, building and late policy. Pt's wife acknowledged.

## 2018-11-05 ENCOUNTER — Encounter: Payer: Self-pay | Admitting: Family Medicine

## 2018-11-05 ENCOUNTER — Other Ambulatory Visit: Payer: Self-pay

## 2018-11-05 ENCOUNTER — Ambulatory Visit (INDEPENDENT_AMBULATORY_CARE_PROVIDER_SITE_OTHER): Payer: BLUE CROSS/BLUE SHIELD | Admitting: Family Medicine

## 2018-11-05 VITALS — BP 138/84 | HR 69 | Temp 98.5°F | Ht 74.0 in | Wt 277.8 lb

## 2018-11-05 DIAGNOSIS — F0781 Postconcussional syndrome: Secondary | ICD-10-CM | POA: Diagnosis not present

## 2018-11-05 DIAGNOSIS — E1165 Type 2 diabetes mellitus with hyperglycemia: Secondary | ICD-10-CM

## 2018-11-05 DIAGNOSIS — E785 Hyperlipidemia, unspecified: Secondary | ICD-10-CM

## 2018-11-05 LAB — POCT GLYCOSYLATED HEMOGLOBIN (HGB A1C): Hemoglobin A1C: 10.5 % — AB (ref 4.0–5.6)

## 2018-11-05 MED ORDER — METFORMIN HCL 1000 MG PO TABS: 1000.0000 mg | ORAL_TABLET | Freq: Two times a day (BID) | ORAL | 3 refills | Status: DC

## 2018-11-05 MED ORDER — GLUCOSE BLOOD VI STRP: ORAL_STRIP | 12 refills | Status: DC

## 2018-11-05 MED ORDER — ATORVASTATIN CALCIUM 20 MG PO TABS: 20.0000 mg | ORAL_TABLET | Freq: Every day | ORAL | 3 refills | Status: DC

## 2018-11-05 MED ORDER — DAPAGLIFLOZIN PROPANEDIOL 10 MG PO TABS: 10.0000 mg | ORAL_TABLET | Freq: Every day | ORAL | 3 refills | Status: DC

## 2018-11-05 MED ORDER — DULAGLUTIDE 0.75 MG/0.5ML ~~LOC~~ SOAJ: 0.7500 mg | SUBCUTANEOUS | 5 refills | Status: DC

## 2018-11-05 MED ORDER — AMITRIPTYLINE HCL 25 MG PO TABS: 25.0000 mg | ORAL_TABLET | Freq: Every day | ORAL | 0 refills | Status: DC

## 2018-11-05 NOTE — Progress Notes (Signed)
1/10/20209:15 AM  Andrew Reeves July 14, 1966, 53 y.o. male [id number]  Chief Complaint  Patient presents with  . Diabetes    follow up, had a concussion due to resident at his job physically Avis him  . Medication Refill    fariga, glucophage, and glucose strips    HPI:   Patient is a 53 y.o. male with past medical history significant for  DM2 and HLP  who presents today for routine followup  Last OV in sept Increased farxiga and started on atorvastatin He was doing well regarding diet and exercise Since his concussion had to stop exercising as he gets dizzy Tolerating atorvastatin well Checking cbgs fasting, 130-140  Suffered concussion on 10/17/18, seen in ED on that day and then on the 29th. He was given zofran and meclizine Blurry vision and dizziness are improving, have gone from constant to intermittent Tinnitus not as noticeable anymore Headaches are daily, takes ibu or aleve Occasionally gets a bit confused, forgets things Has not been working since accident Having issues with insomnia, waking up around 3-4 am and cant go back to sleep  Lab Results  Component Value Date   HGBA1C 9.2 (A) 07/15/2018   HGBA1C 8.6 01/28/2018   HGBA1C 9.5 11/19/2017   Lab Results  Component Value Date   LDLCALC 59 07/15/2018   CREATININE 1.07 07/15/2018    Fall Risk  11/05/2018 07/15/2018 01/28/2018 12/31/2017 06/05/2017  Falls in the past year? 0 No No No No     Depression screen [facility] 2/9 11/05/2018 07/15/2018 01/28/2018  Decreased Interest 0 0 0  Down, Depressed, Hopeless 0 0 0  PHQ - 2 Score 0 0 0    No Known Allergies  Prior to Admission medications   Medication Sig Start Date End Date Taking? Authorizing Provider  atorvastatin (LIPITOR) 20 MG tablet Take 1 tablet (20 mg total) by mouth daily. 07/15/18  Yes Rutherford Guys, MD  dapagliflozin propanediol (FARXIGA) 10 MG TABS tablet Take 10 mg by mouth daily. 07/15/18  Yes Rutherford Guys, MD  meclizine (ANTIVERT) 25 MG  tablet Take 1 tablet (25 mg total) by mouth 3 (three) times daily as needed for dizziness. 10/24/18  Yes Volanda Napoleon, PA-C  metFORMIN (GLUCOPHAGE) 1000 MG tablet Take 1 tablet (1,000 mg total) by mouth 2 (two) times daily with a meal. 07/15/18  Yes Rutherford Guys, MD  naproxen (NAPROSYN) 500 MG tablet Take 1 tablet (500 mg total) by mouth 2 (two) times daily as needed for mild pain, moderate pain or headache (TAKE WITH MEALS.). 06/05/17  Yes Timmothy Euler, [place] D, PA-C  Omega-3 Fatty Acids (FISH OIL) 1000 MG CAPS Take 1,000 mg by mouth daily.   Yes [provider]  ondansetron (ZOFRAN) 4 MG tablet Take 1 tablet (4 mg total) by mouth every 6 (six) hours. 10/24/18  Yes Desma Mcgregor    Past Medical History:  Diagnosis Date  . Diabetes mellitus without complication ([place])    TYPE II  . Hyperlipidemia     No past surgical history on file.  Social History   Tobacco Use  . Smoking status: Never Smoker  . Smokeless tobacco: Never Used  Substance Use Topics  . Alcohol use: No    Family History  Problem Relation Age of Onset  . Diabetes Mother   . Stroke Father   . Heart disease Father        HEART ATTACK    ROS Per hpi  OBJECTIVE:  Blood pressure  138/84, pulse 69, temperature 98.5 F (36.9 C), temperature source Oral, height 6\' 2"  (1.88 m), weight 277 lb 12.8 oz (126 kg), SpO2 98 %. Body mass index is 35.67 kg/m.   Wt Readings from Last 3 Encounters:  11/05/18 277 lb 12.8 oz (126 kg)  10/18/18 286 lb (129.7 kg)  07/15/18 273 lb (123.8 kg)    Physical Exam Vitals signs and nursing note reviewed.  Constitutional:      Appearance: He is well-developed.  HENT:     Head: Normocephalic and atraumatic.  Eyes:     Conjunctiva/sclera: Conjunctivae normal.     Pupils: Pupils are equal, round, and reactive to light.  Neck:     Musculoskeletal: Neck supple.  Cardiovascular:     Rate and Rhythm: Normal rate and regular rhythm.     Heart sounds: No  murmur. No friction rub. No gallop.   Pulmonary:     Effort: Pulmonary effort is normal.     Breath sounds: Normal breath sounds. No wheezing or rales.  Skin:    General: Skin is warm and dry.  Neurological:     Mental Status: He is alert and oriented to person, place, and time.       Results for orders placed or performed in visit on 11/05/18 (from the past 24 hour(s))  POCT glycosylated hemoglobin (Hb A1C)     Status: Abnormal   Collection Time: 11/05/18  9:23 AM  Result Value Ref Range   Hemoglobin A1C 10.5 (A) 4.0 - 5.6 %   HbA1c POC (<> result, manual entry)     HbA1c, POC (prediabetic range)     HbA1c, POC (controlled diabetic range)      ASSESSMENT and PLAN  1. Uncontrolled type 2 diabetes mellitus with hyperglycemia (HCC) Uncontrolled. Adding trulicity, discussed r/se/b. Discussed carb counting.  - Lipid panel - TSH - POCT glycosylated hemoglobin (Hb W1U) - Basic Metabolic Panel  2. Hyperlipidemia, unspecified hyperlipidemia type Checking labs today, medications will be adjusted as needed.  - Lipid panel - TSH  3. Postconcussion syndrome Improving but not resolved. Adding TCA for headaches and insomnia, reviewed r/se/b.   Other orders - amitriptyline (ELAVIL) 25 MG tablet; Take 1-2 tablets (25-50 mg total) by mouth at bedtime. - Dulaglutide (TRULICITY) 2.72 ZD/6.6YQ SOPN; Inject 0.75 mg into the skin once a week. - dapagliflozin propanediol (FARXIGA) 10 MG TABS tablet; Take 10 mg by mouth daily. - atorvastatin (LIPITOR) 20 MG tablet; Take 1 tablet (20 mg total) by mouth daily. - metFORMIN (GLUCOPHAGE) 1000 MG tablet; Take 1 tablet (1,000 mg total) by mouth 2 (two) times daily with a meal. - glucose blood test strip; For appropriate meter, check glucose once a day, Dx E11.65   Return in about 2 weeks (around 11/19/2018) for post conscussion - 2 weeks, DM - 3 months.     Rutherford Guys, MD [facility], [place] [address] Ph.   [phone] Fax [phone]

## 2018-11-05 NOTE — Patient Instructions (Addendum)
TOTAL CARBS PER DAY 180 GM. Carb king app  If you have lab work done today you will be contacted with your lab results within the next 2 weeks.  If you have not heard from [place] then please contact us. The fastest way to get your results is to register for My Chart.   IF you received an x-ray today, you will receive an invoice from [facility]. Please contact [facility] at [phone] with questions or concerns regarding your invoice.   IF you received labwork today, you will receive an invoice from [place] [facility]. Please contact [facility] at [phone] with questions or concerns regarding your invoice.   Our billing staff will not be able to assist you with questions regarding bills from these companies.  You will be contacted with the lab results as soon as they are available. The fastest way to get your results is to activate your My Chart account. Instructions are located on the last page of this paperwork. If you have not heard from [place] regarding the results in 2 weeks, please contact this office.     Carbohydrate Counting for Diabetes Mellitus, Adult  Carbohydrate counting is a method of keeping track of how many carbohydrates you eat. Eating carbohydrates naturally increases the amount of sugar (glucose) in the blood. Counting how many carbohydrates you eat helps keep your blood glucose within normal limits, which helps you manage your diabetes (diabetes mellitus). It is important to know how many carbohydrates you can safely have in each meal. This is different for every person. A diet and nutrition specialist (registered dietitian) can help you make a meal plan and calculate how many carbohydrates you should have at each meal and snack. Carbohydrates are found in the following foods:  Grains, such as breads and cereals.  Dried beans and soy products.  Starchy vegetables, such as potatoes, peas, and corn.  Fruit and fruit juices.  Milk and yogurt.  Sweets and  snack foods, such as cake, cookies, candy, chips, and soft drinks. How do I count carbohydrates? There are two ways to count carbohydrates in food. You can use either of the methods or a combination of both. Reading "Nutrition Facts" on packaged food The "Nutrition Facts" list is included on the labels of almost all packaged foods and beverages in the [place] It includes:  The serving size.  Information about nutrients in each serving, including the grams (g) of carbohydrate per serving. To use the "Nutrition Facts":  Decide how many servings you will have.  Multiply the number of servings by the number of carbohydrates per serving.  The resulting number is the total amount of carbohydrates that you will be having. Learning standard serving sizes of other foods When you eat carbohydrate foods that are not packaged or do not include "Nutrition Facts" on the label, you need to measure the servings in order to count the amount of carbohydrates:  Measure the foods that you will eat with a food scale or measuring cup, if needed.  Decide how many standard-size servings you will eat.  Multiply the number of servings by 15. Most carbohydrate-rich foods have about 15 g of carbohydrates per serving. ? For example, if you eat 8 oz (170 g) of strawberries, you will have eaten 2 servings and 30 g of carbohydrates (2 servings x 15 g = 30 g).  For foods that have more than one food mixed, such as soups and casseroles, you must count the carbohydrates in each food that is included.  The following list contains standard serving sizes of common carbohydrate-rich foods. Each of these servings has about 15 g of carbohydrates:   hamburger bun or  English muffin.   oz (15 mL) syrup.   oz (14 g) jelly.  1 slice of bread.  1 six-inch tortilla.  3 oz (85 g) cooked rice or pasta.  4 oz (113 g) cooked dried beans.  4 oz (113 g) starchy vegetable, such as peas, corn, or potatoes.  4 oz (113 g) hot  cereal.  4 oz (113 g) mashed potatoes or  of a large baked potato.  4 oz (113 g) canned or frozen fruit.  4 oz (120 mL) fruit juice.  4-6 crackers.  6 chicken nuggets.  6 oz (170 g) unsweetened dry cereal.  6 oz (170 g) plain fat-free yogurt or yogurt sweetened with artificial sweeteners.  8 oz (240 mL) milk.  8 oz (170 g) fresh fruit or one small piece of fruit.  24 oz (680 g) popped popcorn. Example of carbohydrate counting Sample meal  3 oz (85 g) chicken breast.  6 oz (170 g) brown rice.  4 oz (113 g) corn.  8 oz (240 mL) milk.  8 oz (170 g) strawberries with sugar-free whipped topping. Carbohydrate calculation 1. Identify the foods that contain carbohydrates: ? Rice. ? Corn. ? Milk. ? Strawberries. 2. Calculate how many servings you have of each food: ? 2 servings rice. ? 1 serving corn. ? 1 serving milk. ? 1 serving strawberries. 3. Multiply each number of servings by 15 g: ? 2 servings rice x 15 g = 30 g. ? 1 serving corn x 15 g = 15 g. ? 1 serving milk x 15 g = 15 g. ? 1 serving strawberries x 15 g = 15 g. 4. Add together all of the amounts to find the total grams of carbohydrates eaten: ? 30 g + 15 g + 15 g + 15 g = 75 g of carbohydrates total. Summary  Carbohydrate counting is a method of keeping track of how many carbohydrates you eat.  Eating carbohydrates naturally increases the amount of sugar (glucose) in the blood.  Counting how many carbohydrates you eat helps keep your blood glucose within normal limits, which helps you manage your diabetes.  A diet and nutrition specialist (registered dietitian) can help you make a meal plan and calculate how many carbohydrates you should have at each meal and snack. This information is not intended to replace advice given to you by your health care provider. Make sure you discuss any questions you have with your health care provider. Document Released: 10/13/2005 Document Revised: 04/22/2017 Document  Reviewed: 03/26/2016 Elsevier Interactive Patient Education  2019 [employer].

## 2018-11-06 LAB — BASIC METABOLIC PANEL
BUN/Creatinine Ratio: 11 (ref 9–20)
BUN: 12 mg/dL (ref 6–24)
CO2: 19 mmol/L — ABNORMAL LOW (ref 20–29)
Calcium: 9.3 mg/dL (ref 8.7–10.2)
Chloride: 104 mmol/L (ref 96–106)
Creatinine, Ser: 1.12 mg/dL (ref 0.76–1.27)
GFR calc Af Amer: 85 mL/min/{1.73_m2} (ref >59)
GFR calc non Af Amer: 74 mL/min/{1.73_m2} (ref >59)
Glucose: 128 mg/dL — ABNORMAL HIGH (ref 65–99)
Potassium: 4.3 mmol/L (ref 3.5–5.2)
Sodium: 142 mmol/L (ref 134–144)

## 2018-11-06 LAB — LIPID PANEL
Chol/HDL Ratio: 2.6 ratio (ref 0.0–5.0)
Cholesterol, Total: 157 mg/dL (ref 100–199)
HDL: 61 mg/dL (ref >39)
LDL Calculated: 79 mg/dL (ref 0–99)
Triglycerides: 83 mg/dL (ref 0–149)
VLDL Cholesterol Cal: 17 mg/dL (ref 5–40)

## 2018-11-06 LAB — TSH: TSH: 2.39 u[IU]/mL (ref 0.450–4.500)

## 2018-11-10 ENCOUNTER — Telehealth: Payer: Self-pay | Admitting: Family Medicine

## 2018-11-10 NOTE — Telephone Encounter (Signed)
Patient was denied his medication for Trulicity.

## 2018-11-11 ENCOUNTER — Telehealth: Payer: Self-pay | Admitting: Family Medicine

## 2018-11-11 NOTE — Telephone Encounter (Signed)
Copied from [place] [id number]. Topic: Quick Communication - See Telephone Encounter >> Nov 11, 2018  9:56 AM Rayann Heman wrote: CRM for notification. See Telephone encounter for: 11/11/18. Pt called and stated that he would like a note stating that he can return back to work. Pt would like a call back from Dr Pamella Pert. Please advise

## 2018-11-12 ENCOUNTER — Encounter: Payer: Self-pay | Admitting: Family Medicine

## 2018-11-12 NOTE — Telephone Encounter (Signed)
pls see note 

## 2018-11-15 NOTE — Telephone Encounter (Signed)
Patient's concern/request has been addressed already by another provider or staff member. 

## 2018-11-19 ENCOUNTER — Ambulatory Visit: Payer: Self-pay | Admitting: Family Medicine

## 2018-11-30 ENCOUNTER — Telehealth: Payer: Self-pay | Admitting: Family Medicine

## 2018-11-30 NOTE — Telephone Encounter (Signed)
Copied from [place] [id number]. Topic: Quick Communication - Rx Refill/Question >> Nov 30, 2018  1:25 PM Alanda Slim E wrote: Medication: Dulaglutide (TRULICITY) 4.38 VK/1.8MC SOPN - the PA for this medication was denied. Jeneen Rinks from [facility] sent over appeal paperwork and wanted to confirm that it was received and to see if their were any questions/ please advise    Preferred Pharmacy (with phone number or street name):

## 2018-12-02 NOTE — Telephone Encounter (Signed)
Please process Trulicty for escalation of treatment. A1c not at goal, patient already on max dose of metformin and farxiga Thanks you

## 2018-12-08 ENCOUNTER — Encounter: Payer: Self-pay | Admitting: Family Medicine

## 2018-12-08 ENCOUNTER — Ambulatory Visit (INDEPENDENT_AMBULATORY_CARE_PROVIDER_SITE_OTHER): Payer: BLUE CROSS/BLUE SHIELD | Admitting: Family Medicine

## 2018-12-08 ENCOUNTER — Other Ambulatory Visit: Payer: Self-pay

## 2018-12-08 VITALS — BP 132/82 | HR 74 | Temp 98.5°F | Resp 20 | Ht 73.11 in | Wt 271.8 lb

## 2018-12-08 DIAGNOSIS — F0781 Postconcussional syndrome: Secondary | ICD-10-CM | POA: Diagnosis not present

## 2018-12-08 DIAGNOSIS — E1165 Type 2 diabetes mellitus with hyperglycemia: Secondary | ICD-10-CM

## 2018-12-08 MED ORDER — DAPAGLIFLOZIN PROPANEDIOL 10 MG PO TABS: 10.0000 mg | ORAL_TABLET | Freq: Every day | ORAL | 3 refills | Status: DC

## 2018-12-08 MED ORDER — ATORVASTATIN CALCIUM 20 MG PO TABS: 20.0000 mg | ORAL_TABLET | Freq: Every day | ORAL | 3 refills | Status: DC

## 2018-12-08 NOTE — Patient Instructions (Signed)
° ° ° °  If you have lab work done today you will be contacted with your lab results within the next 2 weeks.  If you have not heard from us then please contact us. The fastest way to get your results is to register for My Chart. ° ° °IF you received an x-ray today, you will receive an invoice from [facility]. Please contact [facility] at [phone] with questions or concerns regarding your invoice.  ° °IF you received labwork today, you will receive an invoice from [facility]. Please contact [facility] at [phone] with questions or concerns regarding your invoice.  ° °Our billing staff will not be able to assist you with questions regarding bills from these companies. ° °You will be contacted with the lab results as soon as they are available. The fastest way to get your results is to activate your My Chart account. Instructions are located on the last page of this paperwork. If you have not heard from us regarding the results in 2 weeks, please contact this office. °  ° ° ° °

## 2018-12-08 NOTE — Progress Notes (Signed)
2/12/20202:59 PM  Andrew Reeves 02/22/1966, 53 y.o. male [id number]  Chief Complaint  Patient presents with  . Consussion    f/u- pt need a note to return back to work    HPI:   Patient is a 53 y.o. male with past medical history significant for DM2 and HLP  who presents today for followup concussion  Took amitriptyline for about 2 weeks Since then no more headaches, insomnia, dizziness, memory issues All concussion symptoms have resolved He has been driving wo issues  Reports cbgs are coming down Taking metformin and farxiga as prescribed and working on diet Reports today's fasting 127 Patient wants to hold off on starting trulicity as cbgs are getting better  Fall Risk  12/08/2018 11/05/2018 07/15/2018 01/28/2018 12/31/2017  Falls in the past year? 0 0 No No No  Number falls in past yr: 0 - - - -  Injury with Fall? 0 - - - -  Follow up Falls evaluation completed - - - -     Depression screen [facility] 2/9 12/08/2018 11/05/2018 07/15/2018  Decreased Interest 0 0 0  Down, Depressed, Hopeless 0 0 0  PHQ - 2 Score 0 0 0    No Known Allergies  Prior to Admission medications   Medication Sig Start Date End Date Taking? Authorizing Provider  Dulaglutide (TRULICITY) 6.96 VE/9.3YB SOPN Inject 0.75 mg into the skin once a week. 11/05/18  Yes Rutherford Guys, MD  glucose blood test strip For appropriate meter, check glucose once a day, Dx E11.65 11/05/18  Yes Rutherford Guys, MD  metFORMIN (GLUCOPHAGE) 1000 MG tablet Take 1 tablet (1,000 mg total) by mouth 2 (two) times daily with a meal. 11/05/18  Yes Rutherford Guys, MD  naproxen (NAPROSYN) 500 MG tablet Take 1 tablet (500 mg total) by mouth 2 (two) times daily as needed for mild pain, moderate pain or headache (TAKE WITH MEALS.). 06/05/17  Yes Timmothy Euler, [place] D, PA-C  amitriptyline (ELAVIL) 25 MG tablet Take 1-2 tablets (25-50 mg total) by mouth at bedtime. Patient not taking: Reported on 12/08/2018 11/05/18   Rutherford Guys, MD    atorvastatin (LIPITOR) 20 MG tablet Take 1 tablet (20 mg total) by mouth daily. Patient not taking: Reported on 12/08/2018 11/05/18   Rutherford Guys, MD  dapagliflozin propanediol (FARXIGA) 10 MG TABS tablet Take 10 mg by mouth daily. Patient not taking: Reported on 12/08/2018 11/05/18   Rutherford Guys, MD  meclizine (ANTIVERT) 25 MG tablet Take 1 tablet (25 mg total) by mouth 3 (three) times daily as needed for dizziness. Patient not taking: Reported on 12/08/2018 10/24/18   Volanda Napoleon, PA-C  Omega-3 Fatty Acids (FISH OIL) 1000 MG CAPS Take 1,000 mg by mouth daily.    [provider]  ondansetron (ZOFRAN) 4 MG tablet Take 1 tablet (4 mg total) by mouth every 6 (six) hours. Patient not taking: Reported on 12/08/2018 10/24/18   Desma Mcgregor    Past Medical History:  Diagnosis Date  . Diabetes mellitus without complication ([place])    TYPE II  . Hyperlipidemia     History reviewed. No pertinent surgical history.  Social History   Tobacco Use  . Smoking status: Never Smoker  . Smokeless tobacco: Never Used  Substance Use Topics  . Alcohol use: No    Family History  Problem Relation Age of Onset  . Diabetes Mother   . Stroke Father   . Heart disease Father  HEART ATTACK    ROS Per hpi  OBJECTIVE:  Blood pressure 132/82, pulse 74, temperature 98.5 F (36.9 C), temperature source Oral, resp. rate 20, height 6' 1.11" (1.857 m), weight 271 lb 12.8 oz (123.3 kg), SpO2 97 %. Body mass index is 35.75 kg/m.   Physical Exam Vitals signs and nursing note reviewed.  Constitutional:      Appearance: He is well-developed.  HENT:     Head: Normocephalic and atraumatic.  Eyes:     Conjunctiva/sclera: Conjunctivae normal.     Pupils: Pupils are equal, round, and reactive to light.  Neck:     Musculoskeletal: Neck supple.  Pulmonary:     Effort: Pulmonary effort is normal.  Skin:    General: Skin is warm and dry.  Neurological:     Mental  Status: He is alert and oriented to person, place, and time.    ASSESSMENT and PLAN  1. Postconcussion syndrome Resolved. Patient able to return to work without restrictions  2. Uncontrolled type 2 diabetes mellitus with hyperglycemia ([place]) Discussed with patient that based on last a1c of 10, I though he would still need trulicity but will see what next a1c is. Continue with med adherence and LFM.  Other orders - dapagliflozin propanediol (FARXIGA) 10 MG TABS tablet; Take 10 mg by mouth daily. - atorvastatin (LIPITOR) 20 MG tablet; Take 1 tablet (20 mg total) by mouth daily.  Return for as scheduled.    Rutherford Guys, MD Primary Care at [place], [place] [address] Ph.  [phone] Fax [phone]

## 2018-12-21 ENCOUNTER — Ambulatory Visit (INDEPENDENT_AMBULATORY_CARE_PROVIDER_SITE_OTHER): Payer: BLUE CROSS/BLUE SHIELD | Admitting: Family Medicine

## 2018-12-21 ENCOUNTER — Telehealth: Payer: Self-pay

## 2018-12-21 ENCOUNTER — Telehealth: Payer: Self-pay | Admitting: Family Medicine

## 2018-12-21 DIAGNOSIS — Z111 Encounter for screening for respiratory tuberculosis: Secondary | ICD-10-CM

## 2018-12-21 NOTE — Telephone Encounter (Signed)
Please advise I do not mind witting the note but I want ed to be sure it was ok for the patient to go back to work at 100%

## 2018-12-21 NOTE — Progress Notes (Signed)

## 2018-12-21 NOTE — Telephone Encounter (Signed)
Letter given today

## 2018-12-21 NOTE — Telephone Encounter (Unsigned)
Copied from [place] [id number]. Topic: General - Other >> Dec 20, 2018  5:09 PM [name] wrote: Reason for CRM:   Patient stated a back to work note was written for him on 12/08/2018 but his employer needs a more detailed note written.  They stated they need the note to say that the patient is able to return to work 100%.

## 2018-12-21 NOTE — Progress Notes (Signed)
  Tuberculosis Risk Questionnaire  1. No Were you born outside the [place] in one of the following parts of the world: [place], [place], [place], [place] or [place]?    2. No Have you traveled outside the [place] and lived for more than one month in one of the following parts of the world: [place], [place], [place], [place] or [place]?    3. No Do you have a compromised immune system such as from any of the following conditions:HIV/AIDS, organ or bone marrow transplantation, diabetes, immunosuppressive medicines (e.g. Prednisone, Remicaide), leukemia, lymphoma, cancer of the head or neck, gastrectomy or jejunal bypass, end-stage renal disease (on dialysis), or silicosis?     4. No Have you ever or do you plan on working in: a residential care center, a health care facility, a jail or prison or homeless shelter?    5. No Have you ever: injected illegal drugs, used crack cocaine, lived in a homeless shelter  or been in jail or prison?     6. No Have you ever been exposed to anyone with infectious tuberculosis?  7. No Have you ever had a BCG vaccine? (BCG is a vaccine for tuberculosis  (TB) used in OTHER countries, NOT in the [place]).  8. No Have you ever been advised by a health care provider NOT to have a TB skin test?  9. No Have you ever had a POSITIVE TB skin test?  IF SO, when? n/a  IF SO, were you treated with INH? n/a  IF SO, where? n/a  Tuberculosis Symptom Questionnaire  Do you currently have any of the following symptoms?  1. No Unexplained cough lasting more than 3 weeks?   2. No Unexplained fever lasting more than 3 weeks.   3. No Night Sweats (sweating that leaves the bedclothes and sheets wet)     4. No Shortness of Breath   5. No Chest Pain   6. No Unintentional weight loss    7. No Unexplained fatigue (very tired for no reason)  Pt works in foster care. Will bring forms back for completion when test is read

## 2018-12-23 ENCOUNTER — Ambulatory Visit (INDEPENDENT_AMBULATORY_CARE_PROVIDER_SITE_OTHER): Payer: BLUE CROSS/BLUE SHIELD | Admitting: Family Medicine

## 2018-12-23 DIAGNOSIS — Z111 Encounter for screening for respiratory tuberculosis: Secondary | ICD-10-CM

## 2018-12-23 LAB — TB SKIN TEST
Induration: 0 mm
TB Skin Test: NEGATIVE

## 2018-12-23 NOTE — Progress Notes (Signed)
Had TB read and was negative.

## 2019-01-15 ENCOUNTER — Other Ambulatory Visit: Payer: Self-pay

## 2019-01-15 ENCOUNTER — Encounter: Payer: Self-pay | Admitting: Family Medicine

## 2019-01-15 ENCOUNTER — Ambulatory Visit (INDEPENDENT_AMBULATORY_CARE_PROVIDER_SITE_OTHER): Payer: BLUE CROSS/BLUE SHIELD | Admitting: Family Medicine

## 2019-01-15 VITALS — BP 126/82 | HR 67 | Temp 97.6°F | Resp 18 | Ht 73.1 in | Wt 274.4 lb

## 2019-01-15 DIAGNOSIS — J302 Other seasonal allergic rhinitis: Secondary | ICD-10-CM

## 2019-01-15 MED ORDER — CETIRIZINE HCL 10 MG PO TABS: 10.0000 mg | ORAL_TABLET | Freq: Every day | ORAL | 11 refills | Status: DC

## 2019-01-15 MED ORDER — FLUTICASONE PROPIONATE 50 MCG/ACT NA SUSP: 1.0000 | Freq: Two times a day (BID) | NASAL | 6 refills | Status: DC

## 2019-01-15 NOTE — Patient Instructions (Signed)
° ° ° °  If you have lab work done today you will be contacted with your lab results within the next 2 weeks.  If you have not heard from us then please contact us. The fastest way to get your results is to register for My Chart. ° ° °IF you received an x-ray today, you will receive an invoice from [facility]. Please contact  Radiology at [phone] with questions or concerns regarding your invoice.  ° °IF you received labwork today, you will receive an invoice from [facility]. Please contact [facility] at [phone] with questions or concerns regarding your invoice.  ° °Our billing staff will not be able to assist you with questions regarding bills from these companies. ° °You will be contacted with the lab results as soon as they are available. The fastest way to get your results is to activate your My Chart account. Instructions are located on the last page of this paperwork. If you have not heard from us regarding the results in 2 weeks, please contact this office. °  ° ° ° °

## 2019-01-15 NOTE — Progress Notes (Signed)
3/21/20209:59 AM  Andrew Reeves [birth year], 53 y.o., male [id number]  Chief Complaint  Patient presents with  . sneezing    x1week   . Sore Throat    HPI:   Patient is a 53 y.o. male with past medical history significant for DM2 and HLP who presents today for sneezing and sore throat x 1 week  Having runny nose, coughing, sore throat Having nasal congestion, having sinus pressure Sneezing, itchy eyes and water eyes Started last week  Took some OTC sinus meds for a couple of meds Not taking allergy meds No fevers or SOB  Fall Risk  01/15/2019 12/08/2018 11/05/2018 07/15/2018 01/28/2018  Falls in the past year? 0 0 0 No No  Number falls in past yr: - 0 - - -  Injury with Fall? - 0 - - -  Follow up Falls evaluation completed Falls evaluation completed - - -     Depression screen [facility] 2/9 01/15/2019 12/08/2018 11/05/2018  Decreased Interest 0 0 0  Down, Depressed, Hopeless 0 0 0  PHQ - 2 Score 0 0 0    No Known Allergies  Prior to Admission medications   Medication Sig Start Date End Date Taking? Authorizing Provider  dapagliflozin propanediol (FARXIGA) 10 MG TABS tablet Take 10 mg by mouth daily. 12/08/18  Yes Rutherford Guys, MD  glucose blood test strip For appropriate meter, check glucose once a day, Dx E11.65 11/05/18  Yes Rutherford Guys, MD  metFORMIN (GLUCOPHAGE) 1000 MG tablet Take 1 tablet (1,000 mg total) by mouth 2 (two) times daily with a meal. 11/05/18  Yes Rutherford Guys, MD  naproxen (NAPROSYN) 500 MG tablet Take 1 tablet (500 mg total) by mouth 2 (two) times daily as needed for mild pain, moderate pain or headache (TAKE WITH MEALS.). 06/05/17  Yes Timmothy Euler, [place] D, PA-C  Omega-3 Fatty Acids (FISH OIL) 1000 MG CAPS Take 1,000 mg by mouth daily.   Yes [provider]  atorvastatin (LIPITOR) 20 MG tablet Take 1 tablet (20 mg total) by mouth daily. Patient not taking: Reported on 01/15/2019 12/08/18   Rutherford Guys, MD  Dulaglutide (TRULICITY) 6.75  FF/6.3WG SOPN Inject 0.75 mg into the skin once a week. Patient not taking: Reported on 01/15/2019 11/05/18   Rutherford Guys, MD    Past Medical History:  Diagnosis Date  . Diabetes mellitus without complication ([place])    TYPE II  . Hyperlipidemia     No past surgical history on file.  Social History   Tobacco Use  . Smoking status: Never Smoker  . Smokeless tobacco: Never Used  Substance Use Topics  . Alcohol use: No    Family History  Problem Relation Age of Onset  . Diabetes Mother   . Stroke Father   . Heart disease Father        HEART ATTACK    ROS Per hpi  OBJECTIVE:  Today's Vitals   01/15/19 0957  BP: 126/82  Pulse: 67  Resp: 18  Temp: 97.6 F (36.4 C)  TempSrc: Oral  SpO2: 98%  Weight: 274 lb 6.4 oz (124.5 kg)  Height: 6' 1.1" (1.857 m)   Body mass index is 36.1 kg/m.   Physical Exam Vitals signs and nursing note reviewed.  Constitutional:      Appearance: He is well-developed.  HENT:     Head: Normocephalic and atraumatic.     Right Ear: Hearing, tympanic membrane, ear canal and external ear normal.  Left Ear: Hearing, tympanic membrane, ear canal and external ear normal.     Nose: Rhinorrhea (clear) present.     Mouth/Throat:     Pharynx: Posterior oropharyngeal erythema present. No pharyngeal swelling or oropharyngeal exudate.     Tonsils: No tonsillar exudate. 1+ on the right. 1+ on the left.  Eyes:     Conjunctiva/sclera: Conjunctivae normal.     Pupils: Pupils are equal, round, and reactive to light.  Neck:     Musculoskeletal: Neck supple.  Cardiovascular:     Rate and Rhythm: Normal rate and regular rhythm.     Heart sounds: No murmur. No friction rub. No gallop.   Pulmonary:     Effort: Pulmonary effort is normal.     Breath sounds: Normal breath sounds. No wheezing or rales.  Lymphadenopathy:     Cervical: No cervical adenopathy.  Skin:    General: Skin is warm and dry.  Neurological:     Mental Status: He is alert  and oriented to person, place, and time.      ASSESSMENT and PLAN  1. Seasonal allergies Discussed supportive measures, new meds r/se/b and RTC precautions.  Other orders - cetirizine (ZYRTEC) 10 MG tablet; Take 1 tablet (10 mg total) by mouth daily. - fluticasone (FLONASE) 50 MCG/ACT nasal spray; Place 1 spray into both nostrils 2 (two) times daily.  No follow-ups on file.    Rutherford Guys, MD Primary Care at [place], [place] [address] Ph.  [phone] Fax [phone]

## 2019-01-24 ENCOUNTER — Other Ambulatory Visit: Payer: Self-pay

## 2019-01-24 ENCOUNTER — Ambulatory Visit (INDEPENDENT_AMBULATORY_CARE_PROVIDER_SITE_OTHER): Payer: BLUE CROSS/BLUE SHIELD | Admitting: Family Medicine

## 2019-01-24 ENCOUNTER — Ambulatory Visit: Payer: Self-pay

## 2019-01-24 ENCOUNTER — Encounter: Payer: Self-pay | Admitting: Family Medicine

## 2019-01-24 DIAGNOSIS — K047 Periapical abscess without sinus: Secondary | ICD-10-CM | POA: Diagnosis not present

## 2019-01-24 DIAGNOSIS — K0889 Other specified disorders of teeth and supporting structures: Secondary | ICD-10-CM

## 2019-01-24 MED ORDER — AMOXICILLIN 500 MG PO CAPS: 500.0000 mg | ORAL_CAPSULE | Freq: Three times a day (TID) | ORAL | 0 refills | Status: DC

## 2019-01-24 NOTE — Telephone Encounter (Signed)
Wife called needing an appointment for her husband. Pt is having severe right side upper front tooth pain. Wife stated they called the dentist and they were closed. Wife denies any facial swelling or fever. Care advice given to wife and verb understanding. Appt made with PCP for this afternoon. Pt would like a virtual or phone visit if possible. Email and contact verified. Appt made with PCP this afternoon.  Reason for Disposition . [1] SEVERE pain (e.g., excruciating, unable to do any normal activities) AND [2] not improved 2 hours after pain medicine  Answer Assessment - Initial Assessment Questions 1. LOCATION: "Which tooth is hurting?"  (e.g., right-side/left-side, upper/lower, front/back)     Right side-upper-front 2. ONSET: "When did the toothache start?"  (e.g., hours, days)      Yesterday 3. SEVERITY: "How bad is the toothache?"  (Scale 1-10; mild, moderate or severe)   - MILD (1-3): doesn't interfere with chewing    - MODERATE (4-7): interferes with chewing, interferes with normal activities, awakens from sleep     - SEVERE (8-10): unable to eat, unable to do any normal activities, excruciating pain        severe 4. SWELLING: "Is there any visible swelling of your face?"     no 5. OTHER SYMPTOMS: "Do you have any other symptoms?" (e.g., fever)     no 6. PREGNANCY: "Is there any chance you are pregnant?" "When was your last menstrual period?"     n/a  Protocols used: TOOTHACHE-A-AH

## 2019-01-24 NOTE — Progress Notes (Signed)
Virtual Visit via telephone Note  I connected with patient on 01/24/19 at 332pm by telephone and verified that I am speaking with the correct person using two identifiers. Andrew Reeves is currently located at home and patient is currently with her during visit. The provider, Rutherford Guys, MD is located in their office at time of visit.  I discussed the limitations, risks, security and privacy concerns of performing an evaluation and management service by telephone and the availability of in person appointments. I also discussed with the patient that there may be a patient responsible charge related to this service. The patient expressed understanding and agreed to proceed.   Telephone visit today for tooth pain  HPI Tooth pain, really noticed 2-3 days ago Getting worse Able to see small bumps on gum, (looks like pus pocket) Has noticed tooth has shifted Left front tooth Denies any fever, chills, trauma Has not been told he has cavities or gum disease Has reached out to his dentist, closed ?  Fall Risk  01/15/2019 12/08/2018 11/05/2018 07/15/2018 01/28/2018  Falls in the past year? 0 0 0 No No  Number falls in past yr: - 0 - - -  Injury with Fall? - 0 - - -  Follow up Falls evaluation completed Falls evaluation completed - - -     Depression screen [facility] 2/9 01/15/2019 12/08/2018 11/05/2018  Decreased Interest 0 0 0  Down, Depressed, Hopeless 0 0 0  PHQ - 2 Score 0 0 0    No Known Allergies  Prior to Admission medications   Medication Sig Start Date End Date Taking? Authorizing Provider  atorvastatin (LIPITOR) 20 MG tablet Take 1 tablet (20 mg total) by mouth daily. Patient not taking: Reported on 01/15/2019 12/08/18   Rutherford Guys, MD  cetirizine (ZYRTEC) 10 MG tablet Take 1 tablet (10 mg total) by mouth daily. 01/15/19   Rutherford Guys, MD  dapagliflozin propanediol (FARXIGA) 10 MG TABS tablet Take 10 mg by mouth daily. 12/08/18   Rutherford Guys, MD  Dulaglutide  (TRULICITY) 2.13 YQ/6.5HQ SOPN Inject 0.75 mg into the skin once a week. Patient not taking: Reported on 01/15/2019 11/05/18   Rutherford Guys, MD  fluticasone [facility]) 50 MCG/ACT nasal spray Place 1 spray into both nostrils 2 (two) times daily. 01/15/19   Rutherford Guys, MD  glucose blood test strip For appropriate meter, check glucose once a day, Dx E11.65 11/05/18   Rutherford Guys, MD  metFORMIN (GLUCOPHAGE) 1000 MG tablet Take 1 tablet (1,000 mg total) by mouth 2 (two) times daily with a meal. 11/05/18   Rutherford Guys, MD  naproxen (NAPROSYN) 500 MG tablet Take 1 tablet (500 mg total) by mouth 2 (two) times daily as needed for mild pain, moderate pain or headache (TAKE WITH MEALS.). 06/05/17   Tenna Delaine D, PA-C  Omega-3 Fatty Acids (FISH OIL) 1000 MG CAPS Take 1,000 mg by mouth daily.    [provider]    Past Medical History:  Diagnosis Date  . Diabetes mellitus without complication ([place])    TYPE II  . Hyperlipidemia     No past surgical history on file.  Social History   Tobacco Use  . Smoking status: Never Smoker  . Smokeless tobacco: Never Used  Substance Use Topics  . Alcohol use: No    Family History  Problem Relation Age of Onset  . Diabetes Mother   . Stroke Father   . Heart disease Father  HEART ATTACK    ROS Per hpi  Objective  Vitals as reported by the patient: none  There were no vitals filed for this visit.  ASSESSMENT and PLAN  1. Tooth pain 2. Dental abscess Discussed supportive measures, new meds r/se/b and RTC precautions.  Other orders - amoxicillin (AMOXIL) 500 MG capsule; Take 1 capsule (500 mg total) by mouth 3 (three) times daily.  FOLLOW-UP: prn   The above assessment and management plan was discussed with the patient. The patient verbalized understanding of and has agreed to the management plan. Patient is aware to call the clinic if symptoms persist or worsen. Patient is aware when to return to the clinic  for a follow-up visit. Patient educated on when it is appropriate to go to the emergency department.    I provided 8 minutes of non-face-to-face time during this encounter.  Rutherford Guys, MD Primary Care at [place], [place] [address] Ph.  [phone] Fax [phone]

## 2019-01-25 NOTE — Telephone Encounter (Signed)
Pt has an appointment with provider yesterday for concerns and evaluation.

## 2019-01-26 ENCOUNTER — Other Ambulatory Visit: Payer: Self-pay

## 2019-01-26 DIAGNOSIS — E1165 Type 2 diabetes mellitus with hyperglycemia: Secondary | ICD-10-CM

## 2019-02-04 ENCOUNTER — Other Ambulatory Visit: Payer: Self-pay

## 2019-02-04 ENCOUNTER — Telehealth (INDEPENDENT_AMBULATORY_CARE_PROVIDER_SITE_OTHER): Payer: BLUE CROSS/BLUE SHIELD | Admitting: Family Medicine

## 2019-02-04 DIAGNOSIS — E1165 Type 2 diabetes mellitus with hyperglycemia: Secondary | ICD-10-CM | POA: Diagnosis not present

## 2019-02-04 MED ORDER — DAPAGLIFLOZIN PROPANEDIOL 10 MG PO TABS: 10.0000 mg | ORAL_TABLET | Freq: Every day | ORAL | 3 refills | Status: DC

## 2019-02-04 MED ORDER — METFORMIN HCL 1000 MG PO TABS: 1000.0000 mg | ORAL_TABLET | Freq: Two times a day (BID) | ORAL | 3 refills | Status: DC

## 2019-02-04 NOTE — Progress Notes (Signed)
3 month DM f/u

## 2019-02-04 NOTE — Progress Notes (Signed)
Virtual Visit via telephone Note  I connected with patient on 02/04/19 at 936am by telephone and verified that I am speaking with the correct person using two identifiers. Andrew Reeves is currently located at home and patient is currently with her during visit. The provider, Rutherford Guys, MD is located in their office at time of visit.  I discussed the limitations, risks, security and privacy concerns of performing an evaluation and management service by telephone and the availability of in person appointments. I also discussed with the patient that there may be a patient responsible charge related to this service. The patient expressed understanding and agreed to proceed.   Telephone visit today for routine DM2 and HLP followup  HPI Last OV jan 2020 Started trulicity for M5H 84.6 - he decided not to start as he was exercising more and eating better He checks cbgs mostly in the morning, he reports cbgs 130s, when he checks in the evenings he reports 130s He reports he has lost 10 lbs Still taking metformin and farxiga Denies any lows Denies any polyuria or polydipsia Denies any numbness or tingling in his feet Not taking atorvastatin. Wants to take least med needed.  Lab Results  Component Value Date   HGBA1C 10.5 (A) 11/05/2018   HGBA1C 9.2 (A) 07/15/2018   HGBA1C 8.6 01/28/2018   Lab Results  Component Value Date   LDLCALC 79 11/05/2018   CREATININE 1.12 11/05/2018  ?  Fall Risk  02/04/2019 01/15/2019 12/08/2018 11/05/2018 07/15/2018  Falls in the past year? 0 0 0 0 No  Number falls in past yr: 0 - 0 - -  Injury with Fall? 0 - 0 - -  Follow up - Falls evaluation completed Falls evaluation completed - -     Depression screen [facility] 2/9 02/04/2019 01/15/2019 12/08/2018  Decreased Interest 0 0 0  Down, Depressed, Hopeless 0 0 0  PHQ - 2 Score 0 0 0    No Known Allergies  Prior to Admission medications   Medication Sig Start Date End Date Taking? Authorizing Provider   amoxicillin (AMOXIL) 500 MG capsule Take 1 capsule (500 mg total) by mouth 3 (three) times daily. 01/24/19  Yes Rutherford Guys, MD  cetirizine (ZYRTEC) 10 MG tablet Take 1 tablet (10 mg total) by mouth daily. 01/15/19  Yes Rutherford Guys, MD  dapagliflozin propanediol (FARXIGA) 10 MG TABS tablet Take 10 mg by mouth daily. 12/08/18  Yes Rutherford Guys, MD  fluticasone [facility]) 50 MCG/ACT nasal spray Place 1 spray into both nostrils 2 (two) times daily. 01/15/19  Yes Rutherford Guys, MD  glucose blood test strip For appropriate meter, check glucose once a day, Dx E11.65 11/05/18  Yes Rutherford Guys, MD  metFORMIN (GLUCOPHAGE) 1000 MG tablet Take 1 tablet (1,000 mg total) by mouth 2 (two) times daily with a meal. 11/05/18  Yes Rutherford Guys, MD  naproxen (NAPROSYN) 500 MG tablet Take 1 tablet (500 mg total) by mouth 2 (two) times daily as needed for mild pain, moderate pain or headache (TAKE WITH MEALS.). 06/05/17  Yes Timmothy Euler, [place] D, PA-C  Omega-3 Fatty Acids (FISH OIL) 1000 MG CAPS Take 1,000 mg by mouth daily.   Yes [provider]    Past Medical History:  Diagnosis Date  . Diabetes mellitus without complication ([place])    TYPE II  . Hyperlipidemia     No past surgical history on file.  Social History   Tobacco Use  .  Smoking status: Never Smoker  . Smokeless tobacco: Never Used  Substance Use Topics  . Alcohol use: No    Family History  Problem Relation Age of Onset  . Diabetes Mother   . Stroke Father   . Heart disease Father        HEART ATTACK    ROS Per hpi  Objective  Vitals as reported by the patient: none  There were no vitals filed for this visit.  ASSESSMENT and PLAN  1. Uncontrolled type 2 diabetes mellitus with hyperglycemia ([place]) Cont working on LFM. Checking labs, medications will be adjusted as needed.  - Hemoglobin A1c; Future - Comprehensive metabolic panel; Future - Microalbumin/Creatinine Ratio, Urine; Future  Other orders -  dapagliflozin propanediol (FARXIGA) 10 MG TABS tablet; Take 10 mg by mouth daily. - metFORMIN (GLUCOPHAGE) 1000 MG tablet; Take 1 tablet (1,000 mg total) by mouth 2 (two) times daily with a meal.  FOLLOW-UP: 3 months   The above assessment and management plan was discussed with the patient. The patient verbalized understanding of and has agreed to the management plan. Patient is aware to call the clinic if symptoms persist or worsen. Patient is aware when to return to the clinic for a follow-up visit. Patient educated on when it is appropriate to go to the emergency department.    I provided 12 minutes of non-face-to-face time during this encounter.  Rutherford Guys, MD Primary Care at [facility] [place], [place] [address] Ph.  [phone] Fax [phone]

## 2019-02-11 ENCOUNTER — Ambulatory Visit: Payer: Self-pay

## 2019-02-18 ENCOUNTER — Other Ambulatory Visit: Payer: Self-pay

## 2019-02-18 ENCOUNTER — Ambulatory Visit: Payer: Self-pay

## 2019-05-06 ENCOUNTER — Ambulatory Visit: Payer: Self-pay | Admitting: Family Medicine

## 2019-06-09 ENCOUNTER — Other Ambulatory Visit: Payer: Self-pay

## 2019-06-09 ENCOUNTER — Ambulatory Visit: Payer: Self-pay

## 2019-06-09 ENCOUNTER — Ambulatory Visit (INDEPENDENT_AMBULATORY_CARE_PROVIDER_SITE_OTHER): Payer: 59 | Admitting: Family Medicine

## 2019-06-09 ENCOUNTER — Encounter: Payer: Self-pay | Admitting: Family Medicine

## 2019-06-09 ENCOUNTER — Telehealth: Payer: Self-pay | Admitting: Family Medicine

## 2019-06-09 VITALS — BP 130/85 | HR 92 | Temp 98.3°F | Ht 73.1 in | Wt 277.8 lb

## 2019-06-09 DIAGNOSIS — E1165 Type 2 diabetes mellitus with hyperglycemia: Secondary | ICD-10-CM

## 2019-06-09 DIAGNOSIS — Z125 Encounter for screening for malignant neoplasm of prostate: Secondary | ICD-10-CM

## 2019-06-09 DIAGNOSIS — Z119 Encounter for screening for infectious and parasitic diseases, unspecified: Secondary | ICD-10-CM | POA: Diagnosis not present

## 2019-06-09 DIAGNOSIS — D171 Benign lipomatous neoplasm of skin and subcutaneous tissue of trunk: Secondary | ICD-10-CM

## 2019-06-09 DIAGNOSIS — E785 Hyperlipidemia, unspecified: Secondary | ICD-10-CM

## 2019-06-09 LAB — POCT GLYCOSYLATED HEMOGLOBIN (HGB A1C): Hemoglobin A1C: 11.2 % — AB (ref 4.0–5.6)

## 2019-06-09 MED ORDER — DAPAGLIFLOZIN PROPANEDIOL 10 MG PO TABS: 10.0000 mg | ORAL_TABLET | Freq: Every day | ORAL | 3 refills | Status: DC

## 2019-06-09 MED ORDER — TRULICITY 0.75 MG/0.5ML ~~LOC~~ SOAJ: 0.7500 mg | SUBCUTANEOUS | 3 refills | Status: DC

## 2019-06-09 MED ORDER — GLUCOSE BLOOD VI STRP: ORAL_STRIP | 12 refills | Status: DC

## 2019-06-09 NOTE — Telephone Encounter (Signed)
Please provide patient with coupon. Also I wonder if PA is needed. Please investigate. thanks

## 2019-06-09 NOTE — Patient Instructions (Addendum)
Carbohydrate Counting for Diabetes Mellitus, Adult  Carbohydrate counting is a method of keeping track of how many carbohydrates you eat. Eating carbohydrates naturally increases the amount of sugar (glucose) in the blood. Counting how many carbohydrates you eat helps keep your blood glucose within normal limits, which helps you manage your diabetes (diabetes mellitus). It is important to know how many carbohydrates you can safely have in each meal. This is different for every person. A diet and nutrition specialist (registered dietitian) can help you make a meal plan and calculate how many carbohydrates you should have at each meal and snack. Carbohydrates are found in the following foods:  Grains, such as breads and cereals.  Dried beans and soy products.  Starchy vegetables, such as potatoes, peas, and corn.  Fruit and fruit juices.  Milk and yogurt.  Sweets and snack foods, such as cake, cookies, candy, chips, and soft drinks. How do I count carbohydrates? There are two ways to count carbohydrates in food. You can use either of the methods or a combination of both. Reading "Nutrition Facts" on packaged food The "Nutrition Facts" list is included on the labels of almost all packaged foods and beverages in the [place] It includes:  The serving size.  Information about nutrients in each serving, including the grams (g) of carbohydrate per serving. To use the "Nutrition Facts":  Decide how many servings you will have.  Multiply the number of servings by the number of carbohydrates per serving.  The resulting number is the total amount of carbohydrates that you will be having. Learning standard serving sizes of other foods When you eat carbohydrate foods that are not packaged or do not include "Nutrition Facts" on the label, you need to measure the servings in order to count the amount of carbohydrates:  Measure the foods that you will eat with a food scale or measuring cup, if  needed.  Decide how many standard-size servings you will eat.  Multiply the number of servings by 15. Most carbohydrate-rich foods have about 15 g of carbohydrates per serving. ? For example, if you eat 8 oz (170 g) of strawberries, you will have eaten 2 servings and 30 g of carbohydrates (2 servings x 15 g = 30 g).  For foods that have more than one food mixed, such as soups and casseroles, you must count the carbohydrates in each food that is included. The following list contains standard serving sizes of common carbohydrate-rich foods. Each of these servings has about 15 g of carbohydrates:   hamburger bun or  English muffin.   oz (15 mL) syrup.   oz (14 g) jelly.  1 slice of bread.  1 six-inch tortilla.  3 oz (85 g) cooked rice or pasta.  4 oz (113 g) cooked dried beans.  4 oz (113 g) starchy vegetable, such as peas, corn, or potatoes.  4 oz (113 g) hot cereal.  4 oz (113 g) mashed potatoes or  of a large baked potato.  4 oz (113 g) canned or frozen fruit.  4 oz (120 mL) fruit juice.  4-6 crackers.  6 chicken nuggets.  6 oz (170 g) unsweetened dry cereal.  6 oz (170 g) plain fat-free yogurt or yogurt sweetened with artificial sweeteners.  8 oz (240 mL) milk.  8 oz (170 g) fresh fruit or one small piece of fruit.  24 oz (680 g) popped popcorn. Example of carbohydrate counting Sample meal  3 oz (85 g) chicken breast.  6 oz (  170 g) brown rice.  4 oz (113 g) corn.  8 oz (240 mL) milk.  8 oz (170 g) strawberries with sugar-free whipped topping. Carbohydrate calculation 1. Identify the foods that contain carbohydrates: ? Rice. ? Corn. ? Milk. ? Strawberries. 2. Calculate how many servings you have of each food: ? 2 servings rice. ? 1 serving corn. ? 1 serving milk. ? 1 serving strawberries. 3. Multiply each number of servings by 15 g: ? 2 servings rice x 15 g = 30 g. ? 1 serving corn x 15 g = 15 g. ? 1 serving milk x 15 g = 15 g. ? 1  serving strawberries x 15 g = 15 g. 4. Add together all of the amounts to find the total grams of carbohydrates eaten: ? 30 g + 15 g + 15 g + 15 g = 75 g of carbohydrates total. Summary  Carbohydrate counting is a method of keeping track of how many carbohydrates you eat.  Eating carbohydrates naturally increases the amount of sugar (glucose) in the blood.  Counting how many carbohydrates you eat helps keep your blood glucose within normal limits, which helps you manage your diabetes.  A diet and nutrition specialist (registered dietitian) can help you make a meal plan and calculate how many carbohydrates you should have at each meal and snack. This information is not intended to replace advice given to you by your health care provider. Make sure you discuss any questions you have with your health care provider. Document Released: 10/13/2005 Document Revised: 05/07/2017 Document Reviewed: 03/26/2016 Elsevier Patient Education  [employer].  If you have lab work done today you will be contacted with your lab results within the next 2 weeks.  If you have not heard from [place] then please contact us. The fastest way to get your results is to register for My Chart.   IF you received an x-ray today, you will receive an invoice from [facility]. Please contact [facility] at [phone] with questions or concerns regarding your invoice.   IF you received labwork today, you will receive an invoice from [facility]. Please contact [facility] at [phone] with questions or concerns regarding your invoice.   Our billing staff will not be able to assist you with questions regarding bills from these companies.  You will be contacted with the lab results as soon as they are available. The fastest way to get your results is to activate your My Chart account. Instructions are located on the last page of this paperwork. If you have not heard from [place] regarding the results in 2 weeks,  please contact this office.

## 2019-06-09 NOTE — Telephone Encounter (Signed)
Pt called requesting alternative meds, placed on clinical list to address immediate needs

## 2019-06-09 NOTE — Telephone Encounter (Signed)
Pt wife called stating that her husband had an office visit today and his A1C was high.  She states he is to start Trulicity. She states that the pharmacy wants $300.  She is requesting alternative to Trulicity and wanting to double his dose of [place].  Ms Archuletta was advised not to double doses unless advised by physician. She was told her husband should  to take all medication as prescribed. She will not pick up the Trulicity tonight. She will monitor BS and if it goes to 400 or over take him to the ER for evaluation. Note will be routed back to office for advice in the AM.   Reason for Disposition . [1] Caller has NON-URGENT medication question about med that PCP prescribed AND [2] triager unable to answer question  Answer Assessment - Initial Assessment Questions 1.   NAME of MEDICATION: "What medicine are you calling about?"     Trulicity 2.   QUESTION: "What is your question?"     cost 3.   PRESCRIBING HCP: "Who prescribed it?" Reason: if prescribed by specialist, call should be referred to that group.     santiago 4. SYMPTOMS: "Do you have any symptoms?"     no 5. SEVERITY: If symptoms are present, ask "Are they mild, moderate or severe?"     Increasing BS 6.  PREGNANCY:  "Is there any chance that you are pregnant?" "When was your last menstrual period?"    N/A  Protocols used: MEDICATION QUESTION CALL-A-AH

## 2019-06-09 NOTE — Progress Notes (Signed)
8/13/20209:17 AM  Andrew Reeves Feb 10, 1966, 53 y.o., male [id number]  Chief Complaint  Patient presents with  . Diabetes  . Medication Refill    strips and farxiga    HPI:   Patient is a 53 y.o. male with past medical history significant for DM2 and HLP who presents today for routine followup  Last OV April 2020 - telemedicine Did not want to add medications, wanted to work on [employer]   Wants to be tested for covid just to check Has not had any symptoms Has been following precautions Working from home  Has not been able to exercise much since [employer] with diet  Checks glucose once a day, fasting, ~ 160s  Last eye exam about 3 years ago Wears glasses No numbness, tingling, polyuria, polydipsia  Has lipoma on left upper back Bothers him when he drives, sit Would like it removed  Lab Results  Component Value Date   HGBA1C 10.5 (A) 11/05/2018   HGBA1C 9.2 (A) 07/15/2018   HGBA1C 8.6 01/28/2018   Lab Results  Component Value Date   LDLCALC 79 11/05/2018   CREATININE 1.12 11/05/2018    Depression screen PHQ 2/9 06/09/2019 02/04/2019 01/15/2019  Decreased Interest 0 0 0  Down, Depressed, Hopeless 0 0 0  PHQ - 2 Score 0 0 0    Fall Risk  06/09/2019 02/04/2019 01/15/2019 12/08/2018 11/05/2018  Falls in the past year? 1 0 0 0 0  Number falls in past yr: 0 0 - 0 -  Injury with Fall? 1 0 - 0 -  Follow up - - Falls evaluation completed Falls evaluation completed -     No Known Allergies  Prior to Admission medications   Medication Sig Start Date End Date Taking? Authorizing Provider  cetirizine (ZYRTEC) 10 MG tablet Take 1 tablet (10 mg total) by mouth daily. 01/15/19  Yes Rutherford Guys, MD  dapagliflozin propanediol (FARXIGA) 10 MG TABS tablet Take 10 mg by mouth daily. 02/04/19  Yes Rutherford Guys, MD  fluticasone (FLONASE) 50 MCG/ACT nasal spray Place 1 spray into both nostrils 2 (two) times daily. 01/15/19  Yes Rutherford Guys, MD  glucose blood test  strip For appropriate meter, check glucose once a day, Dx E11.65 11/05/18  Yes Rutherford Guys, MD  metFORMIN (GLUCOPHAGE) 1000 MG tablet Take 1 tablet (1,000 mg total) by mouth 2 (two) times daily with a meal. 02/04/19  Yes Rutherford Guys, MD  naproxen (NAPROSYN) 500 MG tablet Take 1 tablet (500 mg total) by mouth 2 (two) times daily as needed for mild pain, moderate pain or headache (TAKE WITH MEALS.). 06/05/17  Yes Timmothy Euler, [place] D, PA-C  Omega-3 Fatty Acids (FISH OIL) 1000 MG CAPS Take 1,000 mg by mouth daily.   Yes [provider]    Past Medical History:  Diagnosis Date  . Diabetes mellitus without complication ([place])    TYPE II  . Hyperlipidemia     History reviewed. No pertinent surgical history.  Social History   Tobacco Use  . Smoking status: Never Smoker  . Smokeless tobacco: Never Used  Substance Use Topics  . Alcohol use: No    Family History  Problem Relation Age of Onset  . Diabetes Mother   . Stroke Father   . Heart disease Father        HEART ATTACK    Review of Systems  Constitutional: Negative for chills and fever.  Respiratory: Negative for cough and shortness of breath.  Cardiovascular: Negative for chest pain, palpitations and leg swelling.  Gastrointestinal: Negative for abdominal pain, nausea and vomiting.     OBJECTIVE:  Today's Vitals   06/09/19 0909  BP: 130/85  Pulse: 92  Temp: 98.3 F (36.8 C)  TempSrc: Oral  SpO2: 96%  Weight: 277 lb 12.8 oz (126 kg)  Height: 6' 1.1" (1.857 m)   Body mass index is 36.55 kg/m.  Wt Readings from Last 3 Encounters:  06/09/19 277 lb 12.8 oz (126 kg)  01/15/19 274 lb 6.4 oz (124.5 kg)  12/08/18 271 lb 12.8 oz (123.3 kg)    Physical Exam Vitals signs and nursing note reviewed.  Constitutional:      Appearance: He is well-developed.  HENT:     Head: Normocephalic and atraumatic.  Eyes:     Conjunctiva/sclera: Conjunctivae normal.     Pupils: Pupils are equal, round, and reactive  to light.  Neck:     Musculoskeletal: Neck supple.  Cardiovascular:     Rate and Rhythm: Normal rate and regular rhythm.     Heart sounds: No murmur. No friction rub. No gallop.   Pulmonary:     Effort: Pulmonary effort is normal.     Breath sounds: Normal breath sounds. No wheezing or rales.  Skin:    General: Skin is warm and dry.  Neurological:     Mental Status: He is alert and oriented to person, place, and time.     Results for orders placed or performed in visit on 06/09/19 (from the past 24 hour(s))  POCT glycosylated hemoglobin (Hb A1C)     Status: Abnormal   Collection Time: 06/09/19  9:46 AM  Result Value Ref Range   Hemoglobin A1C 11.2 (A) 4.0 - 5.6 %   HbA1c POC (<> result, manual entry)     HbA1c, POC (prediabetic range)     HbA1c, POC (controlled diabetic range)     ASSESSMENT and PLAN  1. Uncontrolled type 2 diabetes mellitus with hyperglycemia ([facility]) Worse. Again discussed with patient that he struggles with LFM and medications need to be adjusted. On max dose metformin and farxiga. Avoiding meds that can further increase weight gain. Discussed GLP1 r/se/b. Starting low dose trulicity. Referring to CDE. - Microalbumin/Creatinine Ratio, Urine - Comprehensive metabolic panel - POCT glycosylated hemoglobin (Hb A1C) - Ambulatory referral to Ophthalmology - Ambulatory referral to diabetic education  2. Hyperlipidemia, unspecified hyperlipidemia type Checking labs today, medications will be adjusted as needed.   3. Lipoma of back - Ambulatory referral to General Surgery  4. Screening examination for infectious disease - Novel Coronavirus, NAA ([facility]) - self swab in clinic  5. Screening for prostate cancer - PSA  Other orders - Dulaglutide (TRULICITY) 7.10 GY/6.9SW SOPN; Inject 0.75 mg into the skin once a week. - dapagliflozin propanediol (FARXIGA) 10 MG TABS tablet; Take 10 mg by mouth daily. - glucose blood test strip; For appropriate meter, check  glucose once a day, Dx E11.65  Return in about 3 months (around 09/09/2019).    Rutherford Guys, MD Primary Care at [facility] [place], [place] [address] Ph.  [phone] Fax [phone]

## 2019-06-09 NOTE — Telephone Encounter (Signed)
The cost for Dulaglutide (TRULICITY) 5.00 XF/8.1WE SOPN Is $300 after insurance and Pt wife called to see if there is a coupon that Dr. Pamella Pert can give to help with the cost or if there is an alternative to this medication with a lower cost/ please advise   Pt is in need of his medication today. So they would like to get this taken care of asap

## 2019-06-09 NOTE — Telephone Encounter (Signed)
Is there another insulin that Andrew Reeves can take. The trulicity is $791

## 2019-06-10 LAB — COMPREHENSIVE METABOLIC PANEL
ALT: 32 IU/L (ref 0–44)
AST: 28 IU/L (ref 0–40)
Albumin/Globulin Ratio: 1.8 (ref 1.2–2.2)
Albumin: 4.7 g/dL (ref 3.8–4.9)
Alkaline Phosphatase: 73 IU/L (ref 39–117)
BUN/Creatinine Ratio: 13 (ref 9–20)
BUN: 16 mg/dL (ref 6–24)
Bilirubin Total: 0.6 mg/dL (ref 0.0–1.2)
CO2: 18 mmol/L — ABNORMAL LOW (ref 20–29)
Calcium: 9.4 mg/dL (ref 8.7–10.2)
Chloride: 101 mmol/L (ref 96–106)
Creatinine, Ser: 1.25 mg/dL (ref 0.76–1.27)
GFR calc Af Amer: 74 mL/min/{1.73_m2} (ref >59)
GFR calc non Af Amer: 64 mL/min/{1.73_m2} (ref >59)
Globulin, Total: 2.6 g/dL (ref 1.5–4.5)
Glucose: 179 mg/dL — ABNORMAL HIGH (ref 65–99)
Potassium: 4.6 mmol/L (ref 3.5–5.2)
Sodium: 140 mmol/L (ref 134–144)
Total Protein: 7.3 g/dL (ref 6.0–8.5)

## 2019-06-10 LAB — PSA: Prostate Specific Ag, Serum: 0.2 ng/mL (ref 0.0–4.0)

## 2019-06-10 LAB — MICROALBUMIN / CREATININE URINE RATIO
Creatinine, Urine: 138.5 mg/dL
Microalb/Creat Ratio: 17 mg/g creat (ref 0–29)
Microalbumin, Urine: 23.7 ug/mL

## 2019-06-10 NOTE — Telephone Encounter (Signed)
See triage note.

## 2019-06-10 NOTE — Telephone Encounter (Signed)
Aware, duplicate message, looking into this.

## 2019-06-11 LAB — NOVEL CORONAVIRUS, NAA: SARS-CoV-2, NAA: NOT DETECTED

## 2019-06-30 ENCOUNTER — Other Ambulatory Visit: Payer: Self-pay

## 2019-06-30 DIAGNOSIS — E1165 Type 2 diabetes mellitus with hyperglycemia: Secondary | ICD-10-CM

## 2019-06-30 MED ORDER — BLOOD GLUCOSE METER KIT: PACK | 0 refills | Status: DC

## 2019-07-18 IMAGING — CT CT MAXILLOFACIAL W/O CM
3 of 4 series · 8 of 16 positions shown, 9 images · non-contrast
Comparison: None.

CLINICAL DATA: [place]

EXAM:
CT HEAD WITHOUT CONTRAST
CT MAXILLOFACIAL WITHOUT CONTRAST
CT CERVICAL SPINE WITHOUT CONTRAST
TECHNIQUE: Multidetector CT imaging of the head, cervical spine, and
maxillofacial structures were performed using the standard protocol
without intravenous contrast. Multiplanar CT image reconstructions
of the cervical spine and maxillofacial structures were also
generated.

[Series 6: coronal soft tissue · coronal · 0.30mm/px · 2 of 71 slices shown]
[im 24/71  bone]
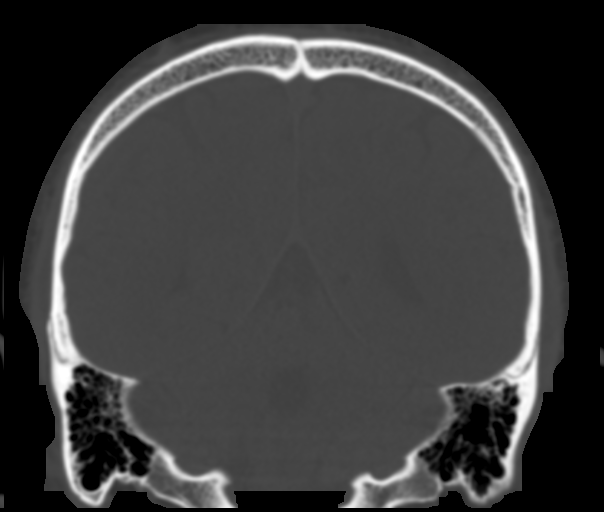
[im 47/71  bone]
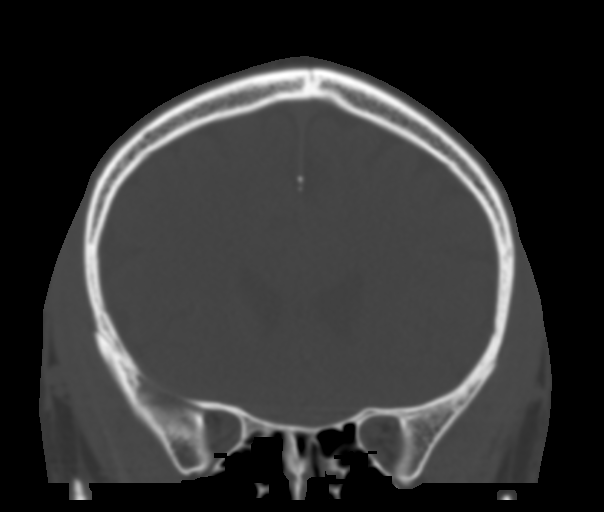

[Series 7: sagittal soft tissue · sagittal · 0.30mm/px · 2 of 59 slices shown]
[im 20/59  bone]
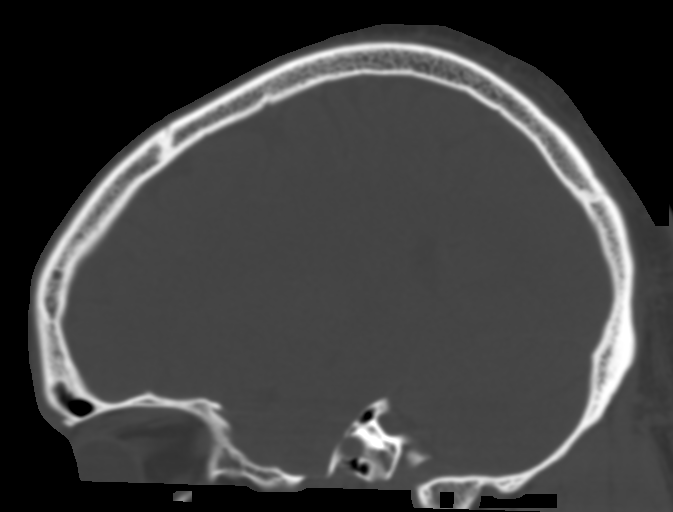
[im 39/59  bone]
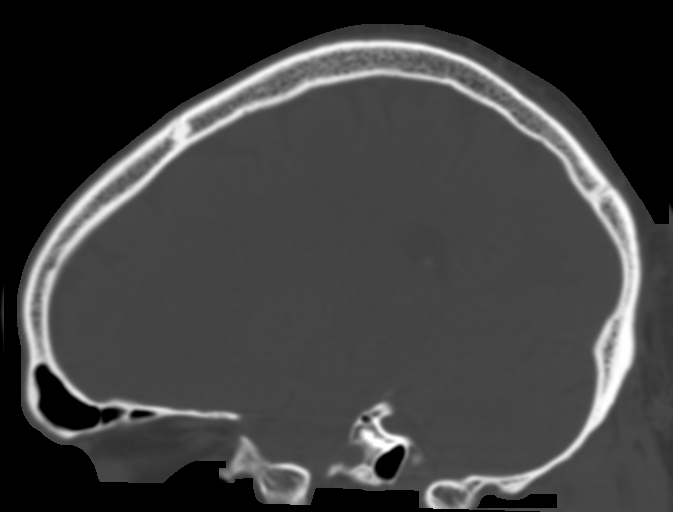

[Series 9: c spine soft · axial · 0.23mm/px · z∈[+1338,+1458]mm · 4 of 100 slices shown, 5 images]
[im 20/100  soft-tissue]
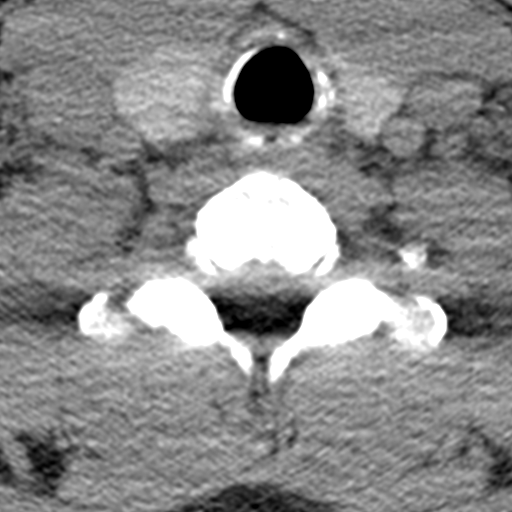
[im 20/100  bone]
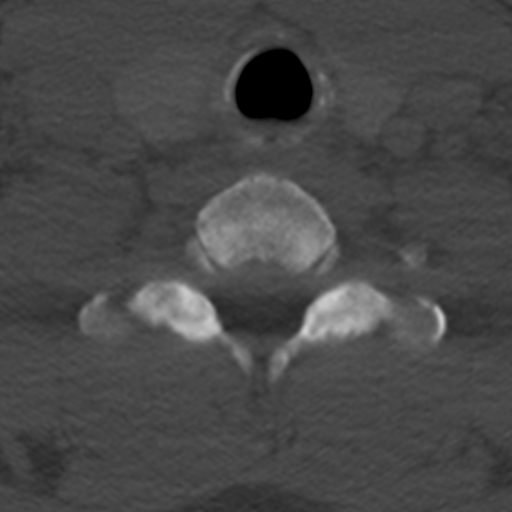
[im 40/100  bone]
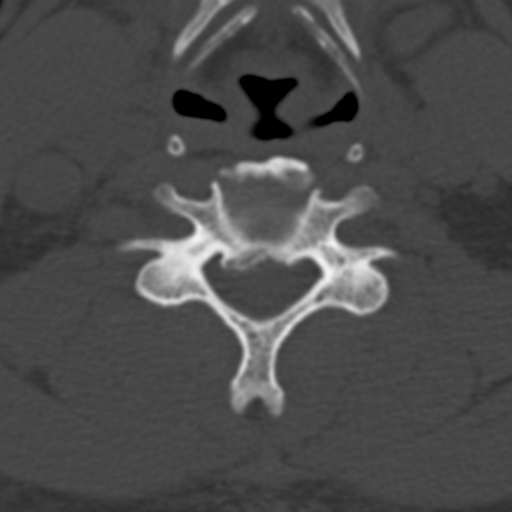
[im 60/100  bone]
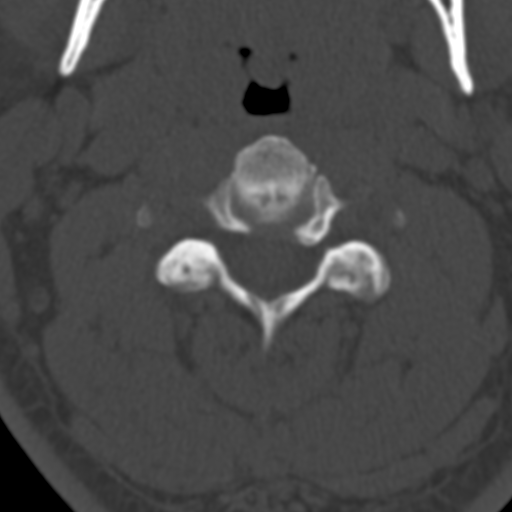
[im 80/100  bone]
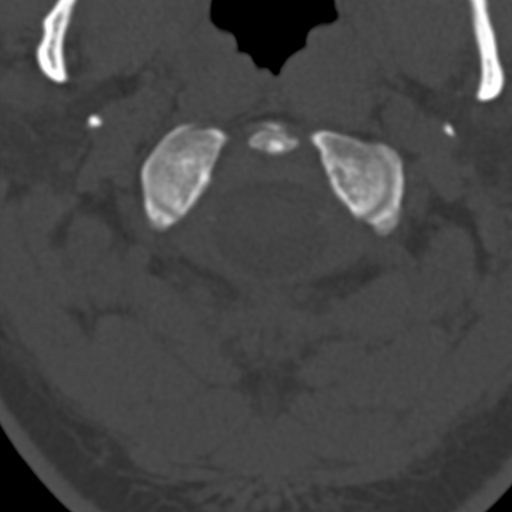

[8 of 16 positions shown; findings below may reference images not displayed]

FINDINGS: CT HEAD FINDINGS

Brain: There is no mass, hemorrhage or extra-axial collection. The
size and configuration of the ventricles and extra-axial CSF spaces
are normal. The brain parenchyma is normal, without evidence of
acute or chronic infarction.

Vascular: No abnormal hyperdensity of the major intracranial
arteries or dural venous sinuses. No intracranial atherosclerosis.

Skull: The visualized skull base, calvarium and extracranial soft
tissues are normal.

CT MAXILLOFACIAL FINDINGS

Osseous:

--Complex facial fracture types: No [place], zygomaticomaxillary
complex or nasoorbitoethmoidal fracture.

--Simple fracture types: None.

--Mandible: No fracture or dislocation.

Orbits: The globes are intact. Normal appearance of the intra- and
extraconal fat. Symmetric extraocular muscles and optic nerves.

Sinuses: No fluid levels or advanced mucosal thickening.

Soft tissues: Normal visualized extracranial soft tissues.

CT CERVICAL SPINE FINDINGS

Alignment: No static subluxation. Facets are aligned. Occipital
condyles and the lateral masses of C1-C2 are aligned.

Skull base and vertebrae: No acute fracture.

Soft tissues and spinal canal: No prevertebral fluid or swelling. No
visible canal hematoma.

Disc levels: Multilevel degenerative disc disease without bony
spinal canal stenosis..

Upper chest: No pneumothorax, pulmonary nodule or pleural effusion.

Other: Normal visualized paraspinal cervical soft tissues.
IMPRESSION: 1. No acute intracranial abnormality.
2. No facial fracture.
3. No acute fracture or static subluxation of the cervical spine.

## 2019-08-05 ENCOUNTER — Ambulatory Visit: Payer: 59 | Admitting: *Deleted

## 2019-09-05 ENCOUNTER — Encounter: Payer: Self-pay | Admitting: Family Medicine

## 2019-09-05 ENCOUNTER — Other Ambulatory Visit: Payer: Self-pay

## 2019-09-05 ENCOUNTER — Ambulatory Visit (INDEPENDENT_AMBULATORY_CARE_PROVIDER_SITE_OTHER): Payer: 59 | Admitting: Family Medicine

## 2019-09-05 VITALS — BP 120/82 | HR 73 | Temp 98.4°F | Ht 74.0 in | Wt 277.0 lb

## 2019-09-05 DIAGNOSIS — Z23 Encounter for immunization: Secondary | ICD-10-CM | POA: Diagnosis not present

## 2019-09-05 DIAGNOSIS — E1165 Type 2 diabetes mellitus with hyperglycemia: Secondary | ICD-10-CM

## 2019-09-05 DIAGNOSIS — E785 Hyperlipidemia, unspecified: Secondary | ICD-10-CM

## 2019-09-05 LAB — CMP14+EGFR
ALT: 29 IU/L (ref 0–44)
AST: 27 IU/L (ref 0–40)
Albumin/Globulin Ratio: 1.7 (ref 1.2–2.2)
Albumin: 4.2 g/dL (ref 3.8–4.9)
Alkaline Phosphatase: 75 IU/L (ref 39–117)
BUN/Creatinine Ratio: 14 (ref 9–20)
BUN: 15 mg/dL (ref 6–24)
Bilirubin Total: 0.7 mg/dL (ref 0.0–1.2)
CO2: 21 mmol/L (ref 20–29)
Calcium: 9.2 mg/dL (ref 8.7–10.2)
Chloride: 102 mmol/L (ref 96–106)
Creatinine, Ser: 1.1 mg/dL (ref 0.76–1.27)
GFR calc Af Amer: 87 mL/min/{1.73_m2} (ref >59)
GFR calc non Af Amer: 75 mL/min/{1.73_m2} (ref >59)
Globulin, Total: 2.5 g/dL (ref 1.5–4.5)
Glucose: 158 mg/dL — ABNORMAL HIGH (ref 65–99)
Potassium: 4.4 mmol/L (ref 3.5–5.2)
Sodium: 139 mmol/L (ref 134–144)
Total Protein: 6.7 g/dL (ref 6.0–8.5)

## 2019-09-05 LAB — POCT GLYCOSYLATED HEMOGLOBIN (HGB A1C): Hemoglobin A1C: 9.5 % — AB (ref 4.0–5.6)

## 2019-09-05 LAB — LIPID PANEL
Chol/HDL Ratio: 5.3 ratio — ABNORMAL HIGH (ref 0.0–5.0)
Cholesterol, Total: 201 mg/dL — ABNORMAL HIGH (ref 100–199)
HDL: 38 mg/dL — ABNORMAL LOW (ref >39)
LDL Chol Calc (NIH): 90 mg/dL (ref 0–99)
Triglycerides: 444 mg/dL — ABNORMAL HIGH (ref 0–149)
VLDL Cholesterol Cal: 73 mg/dL — ABNORMAL HIGH (ref 5–40)

## 2019-09-05 MED ORDER — METFORMIN HCL 1000 MG PO TABS: 1000.0000 mg | ORAL_TABLET | Freq: Two times a day (BID) | ORAL | 3 refills | Status: DC

## 2019-09-05 NOTE — Patient Instructions (Addendum)
   If you have lab work done today you will be contacted with your lab results within the next 2 weeks.  If you have not heard from us then please contact us. The fastest way to get your results is to register for My Chart.   IF you received an x-ray today, you will receive an invoice from [facility]. Please contact [facility] at [phone] with questions or concerns regarding your invoice.   IF you received labwork today, you will receive an invoice from [facility]. Please contact [facility] at [phone] with questions or concerns regarding your invoice.   Our billing staff will not be able to assist you with questions regarding bills from these companies.  You will be contacted with the lab results as soon as they are available. The fastest way to get your results is to activate your My Chart account. Instructions are located on the last page of this paperwork. If you have not heard from us regarding the results in 2 weeks, please contact this office.      Carbohydrate Counting for Diabetes Mellitus, Adult  Carbohydrate counting is a method of keeping track of how many carbohydrates you eat. Eating carbohydrates naturally increases the amount of sugar (glucose) in the blood. Counting how many carbohydrates you eat helps keep your blood glucose within normal limits, which helps you manage your diabetes (diabetes mellitus). It is important to know how many carbohydrates you can safely have in each meal. This is different for every person. A diet and nutrition specialist (registered dietitian) can help you make a meal plan and calculate how many carbohydrates you should have at each meal and snack. Carbohydrates are found in the following foods:  Grains, such as breads and cereals.  Dried beans and soy products.  Starchy vegetables, such as potatoes, peas, and corn.  Fruit and fruit juices.  Milk and yogurt.  Sweets and snack foods, such as cake, cookies, candy,  chips, and soft drinks. How do I count carbohydrates? There are two ways to count carbohydrates in food. You can use either of the methods or a combination of both. Reading "Nutrition Facts" on packaged food The "Nutrition Facts" list is included on the labels of almost all packaged foods and beverages in the [place] It includes:  The serving size.  Information about nutrients in each serving, including the grams (g) of carbohydrate per serving. To use the "Nutrition Facts":  Decide how many servings you will have.  Multiply the number of servings by the number of carbohydrates per serving.  The resulting number is the total amount of carbohydrates that you will be having. Learning standard serving sizes of other foods When you eat carbohydrate foods that are not packaged or do not include "Nutrition Facts" on the label, you need to measure the servings in order to count the amount of carbohydrates:  Measure the foods that you will eat with a food scale or measuring cup, if needed.  Decide how many standard-size servings you will eat.  Multiply the number of servings by 15. Most carbohydrate-rich foods have about 15 g of carbohydrates per serving. ? For example, if you eat 8 oz (170 g) of strawberries, you will have eaten 2 servings and 30 g of carbohydrates (2 servings x 15 g = 30 g).  For foods that have more than one food mixed, such as soups and casseroles, you must count the carbohydrates in each food that is included. The following list contains standard serving sizes   of common carbohydrate-rich foods. Each of these servings has about 15 g of carbohydrates:   hamburger bun or  English muffin.   oz (15 mL) syrup.   oz (14 g) jelly.  1 slice of bread.  1 six-inch tortilla.  3 oz (85 g) cooked rice or pasta.  4 oz (113 g) cooked dried beans.  4 oz (113 g) starchy vegetable, such as peas, corn, or potatoes.  4 oz (113 g) hot cereal.  4 oz (113 g) mashed potatoes or   of a large baked potato.  4 oz (113 g) canned or frozen fruit.  4 oz (120 mL) fruit juice.  4-6 crackers.  6 chicken nuggets.  6 oz (170 g) unsweetened dry cereal.  6 oz (170 g) plain fat-free yogurt or yogurt sweetened with artificial sweeteners.  8 oz (240 mL) milk.  8 oz (170 g) fresh fruit or one small piece of fruit.  24 oz (680 g) popped popcorn. Example of carbohydrate counting Sample meal  3 oz (85 g) chicken breast.  6 oz (170 g) brown rice.  4 oz (113 g) corn.  8 oz (240 mL) milk.  8 oz (170 g) strawberries with sugar-free whipped topping. Carbohydrate calculation 1. Identify the foods that contain carbohydrates: ? Rice. ? Corn. ? Milk. ? Strawberries. 2. Calculate how many servings you have of each food: ? 2 servings rice. ? 1 serving corn. ? 1 serving milk. ? 1 serving strawberries. 3. Multiply each number of servings by 15 g: ? 2 servings rice x 15 g = 30 g. ? 1 serving corn x 15 g = 15 g. ? 1 serving milk x 15 g = 15 g. ? 1 serving strawberries x 15 g = 15 g. 4. Add together all of the amounts to find the total grams of carbohydrates eaten: ? 30 g + 15 g + 15 g + 15 g = 75 g of carbohydrates total. Summary  Carbohydrate counting is a method of keeping track of how many carbohydrates you eat.  Eating carbohydrates naturally increases the amount of sugar (glucose) in the blood.  Counting how many carbohydrates you eat helps keep your blood glucose within normal limits, which helps you manage your diabetes.  A diet and nutrition specialist (registered dietitian) can help you make a meal plan and calculate how many carbohydrates you should have at each meal and snack. This information is not intended to replace advice given to you by your health care provider. Make sure you discuss any questions you have with your health care provider. Document Released: 10/13/2005 Document Revised: 05/07/2017 Document Reviewed: 03/26/2016 Elsevier Patient  Education  2020 Elsevier Inc.  

## 2019-09-05 NOTE — Progress Notes (Signed)
11/9/20209:12 AM  Andrew Reeves 03/30/1966, 53 y.o., male [id number]  Chief Complaint  Patient presents with  . medical conditions    3 m f/u     HPI:   Patient is a 53 y.o. male with past medical history significant for DM2, HLP who presents today for routine followup  Last OV Aug 2020 Started low dose trulicity - he did not start Still taking metformin and farxiga He is not taking a statin, he does take fish oils and flax seed Has been walking qod Has been cutting back fried foods Checking cbgs at home, fasting, ~ 150  Lab Results  Component Value Date   HGBA1C 11.2 (A) 06/09/2019   HGBA1C 10.5 (A) 11/05/2018   HGBA1C 9.2 (A) 07/15/2018   Lab Results  Component Value Date   LDLCALC 79 11/05/2018   CREATININE 1.25 06/09/2019    Depression screen PHQ 2/9 09/05/2019 06/09/2019 02/04/2019  Decreased Interest 0 0 0  Down, Depressed, Hopeless 0 0 0  PHQ - 2 Score 0 0 0    Fall Risk  09/05/2019 06/09/2019 02/04/2019 01/15/2019 12/08/2018  Falls in the past year? 0 1 0 0 0  Number falls in past yr: 0 0 0 - 0  Injury with Fall? 0 1 0 - 0  Follow up Falls evaluation completed - - Falls evaluation completed Falls evaluation completed     No Known Allergies  Prior to Admission medications   Medication Sig Start Date End Date Taking? Authorizing Provider  blood glucose meter kit and supplies Dispense based on patient and insurance preference. Use up to four times daily as directed. (FOR ICD-10 E10.9, E11.9). 06/30/19  Yes Rutherford Guys, MD  dapagliflozin propanediol (FARXIGA) 10 MG TABS tablet Take 10 mg by mouth daily. 06/09/19  Yes Rutherford Guys, MD  glucose blood test strip For appropriate meter, check glucose once a day, Dx E11.65 06/09/19  Yes Rutherford Guys, MD  Lancets (ONETOUCH DELICA PLUS [id number]) MISC USE UP TO FOUR TIMES DAILY AS DIRECTED 07/18/19  Yes [provider]  metFORMIN (GLUCOPHAGE) 1000 MG tablet Take 1 tablet (1,000 mg total) by mouth 2  (two) times daily with a meal. 02/04/19  Yes Rutherford Guys, MD    Past Medical History:  Diagnosis Date  . Diabetes mellitus without complication ([place])    TYPE II  . Hyperlipidemia     No past surgical history on file.  Social History   Tobacco Use  . Smoking status: Never Smoker  . Smokeless tobacco: Never Used  Substance Use Topics  . Alcohol use: No    Family History  Problem Relation Age of Onset  . Diabetes Mother   . Stroke Father   . Heart disease Father        HEART ATTACK    Review of Systems  Constitutional: Negative for chills and fever.  Respiratory: Negative for cough and shortness of breath.   Cardiovascular: Negative for chest pain, palpitations and leg swelling.  Gastrointestinal: Negative for abdominal pain, nausea and vomiting.     OBJECTIVE:  Today's Vitals   09/05/19 0846 09/05/19 0856  BP: (!) 164/82 120/82  Pulse: 73   Temp: 98.4 F (36.9 C)   TempSrc: Oral   SpO2: 97%   Weight: 277 lb (125.6 kg)   Height: _0  (1.88 m)    Body mass index is 35.56 kg/m.  Wt Readings from Last 3 Encounters:  09/05/19 277 lb (125.6 kg)  06/09/19 277  lb 12.8 oz (126 kg)  01/15/19 274 lb 6.4 oz (124.5 kg)    Physical Exam Vitals signs and nursing note reviewed.  Constitutional:      Appearance: He is well-developed.  HENT:     Head: Normocephalic and atraumatic.  Eyes:     Conjunctiva/sclera: Conjunctivae normal.     Pupils: Pupils are equal, round, and reactive to light.  Neck:     Musculoskeletal: Neck supple.  Cardiovascular:     Rate and Rhythm: Normal rate and regular rhythm.     Heart sounds: No murmur. No friction rub. No gallop.   Pulmonary:     Effort: Pulmonary effort is normal.     Breath sounds: Normal breath sounds. No wheezing or rales.  Skin:    General: Skin is warm and dry.  Neurological:     Mental Status: He is alert and oriented to person, place, and time.     Results for orders placed or performed in visit on  09/05/19 (from the past 24 hour(s))  POCT glycosylated hemoglobin (Hb A1C)     Status: Abnormal   Collection Time: 09/05/19  9:29 AM  Result Value Ref Range   Hemoglobin A1C 9.5 (A) 4.0 - 5.6 %   HbA1c POC (<> result, manual entry)     HbA1c, POC (prediabetic range)     HbA1c, POC (controlled diabetic range)      No results found.   ASSESSMENT and PLAN  1. Uncontrolled type 2 diabetes mellitus with hyperglycemia (HCC) Improved. Cont with LFM - POCT glycosylated hemoglobin (Hb A1C) - CMP14+EGFR  2. Hyperlipidemia, unspecified hyperlipidemia type Labs pending. Declines statin. Cont with LFM - Lipid panel  3. Need for immunization against influenza - Flu Vaccine QUAD 36+ mos IM  Other orders - Lancets (ONETOUCH DELICA PLUS [id number]) MISC; USE UP TO FOUR TIMES DAILY AS DIRECTED - metFORMIN (GLUCOPHAGE) 1000 MG tablet; Take 1 tablet (1,000 mg total) by mouth 2 (two) times daily with a meal.  Return in about 3 months (around 12/06/2019).    Rutherford Guys, MD Primary Care at [place], [place] [address] Ph.  [phone] Fax [phone]

## 2019-09-16 ENCOUNTER — Ambulatory Visit: Payer: 59 | Admitting: *Deleted

## 2019-09-19 ENCOUNTER — Telehealth: Payer: Self-pay | Admitting: Family Medicine

## 2019-09-19 NOTE — Telephone Encounter (Signed)
Test results . He showed elevated tryglyceride .  Pt would like to know what will be the course of treatment   Please advise   [phone]  [phone]

## 2019-09-20 NOTE — Telephone Encounter (Signed)
Pls advise.  

## 2019-09-20 NOTE — Telephone Encounter (Signed)
Please let patient know that triglycerides are driven by carbohydrates. Eating less in general but specially carbohydrates and losing weight are first line of treatment. Keep glucose controlled. He can start an over the counter fish oil supplement. Thanks

## 2019-10-07 NOTE — Telephone Encounter (Signed)
Mailbox is full and cannot LVM. Pk/CMA

## 2019-10-11 ENCOUNTER — Encounter: Payer: Self-pay | Admitting: *Deleted

## 2019-10-11 NOTE — Telephone Encounter (Signed)
Message sent [facility]

## 2019-10-13 ENCOUNTER — Ambulatory Visit: Payer: 59 | Admitting: Registered"

## 2019-12-07 ENCOUNTER — Ambulatory Visit: Payer: 59 | Admitting: Family Medicine

## 2019-12-08 ENCOUNTER — Ambulatory Visit: Payer: 59 | Admitting: Family Medicine

## 2020-01-01 ENCOUNTER — Ambulatory Visit: Payer: 59 | Attending: Internal Medicine

## 2020-01-01 DIAGNOSIS — Z23 Encounter for immunization: Secondary | ICD-10-CM | POA: Insufficient documentation

## 2020-01-01 NOTE — Progress Notes (Signed)
   Covid-19 Vaccination Clinic  Name:  Andrew Reeves    MRN: [id number] DOB: 1966/09/03  01/01/2020  [name] was observed post Covid-19 immunization for 15 minutes without incident. He was provided with Vaccine Information Sheet and instruction to access the V-Safe system.   [name] was instructed to call 911 with any severe reactions post vaccine: Marland Kitchen Difficulty breathing  . Swelling of face and throat  . A fast heartbeat  . A bad rash all over body  . Dizziness and weakness   Immunizations Administered    Name Date Dose VIS Date Route   Pfizer COVID-19 Vaccine 01/01/2020  8:33 AM 0.3 mL 10/07/2019 Intramuscular   Manufacturer: [place]   Lot: [id number]   [place]: [id number]

## 2020-01-02 ENCOUNTER — Other Ambulatory Visit: Payer: Self-pay

## 2020-01-02 ENCOUNTER — Ambulatory Visit (INDEPENDENT_AMBULATORY_CARE_PROVIDER_SITE_OTHER): Payer: 59 | Admitting: Family Medicine

## 2020-01-02 ENCOUNTER — Encounter: Payer: Self-pay | Admitting: Family Medicine

## 2020-01-02 VITALS — BP 125/88 | HR 71 | Temp 97.8°F | Resp 18 | Ht 74.0 in | Wt 278.0 lb

## 2020-01-02 DIAGNOSIS — E1165 Type 2 diabetes mellitus with hyperglycemia: Secondary | ICD-10-CM | POA: Diagnosis not present

## 2020-01-02 DIAGNOSIS — E785 Hyperlipidemia, unspecified: Secondary | ICD-10-CM | POA: Diagnosis not present

## 2020-01-02 LAB — POCT GLYCOSYLATED HEMOGLOBIN (HGB A1C): Hemoglobin A1C: 10 % — AB (ref 4.0–5.6)

## 2020-01-02 MED ORDER — TRULICITY 0.75 MG/0.5ML ~~LOC~~ SOAJ: 0.7500 mg | SUBCUTANEOUS | 3 refills | Status: DC

## 2020-01-02 NOTE — Patient Instructions (Signed)
° ° ° °  If you have lab work done today you will be contacted with your lab results within the next 2 weeks.  If you have not heard from us then please contact us. The fastest way to get your results is to register for My Chart. ° ° °IF you received an x-ray today, you will receive an invoice from [facility]. Please contact [facility] at [phone] with questions or concerns regarding your invoice.  ° °IF you received labwork today, you will receive an invoice from [facility]. Please contact [facility] at [phone] with questions or concerns regarding your invoice.  ° °Our billing staff will not be able to assist you with questions regarding bills from these companies. ° °You will be contacted with the lab results as soon as they are available. The fastest way to get your results is to activate your My Chart account. Instructions are located on the last page of this paperwork. If you have not heard from us regarding the results in 2 weeks, please contact this office. °  ° ° ° °

## 2020-01-02 NOTE — Progress Notes (Signed)
3/8/20219:22 AM  Andrew Reeves 10/18/1966, 54 y.o., male [id number]  Chief Complaint  Patient presents with  . Diabetes    HPI:   Patient is a 54 y.o. male with past medical history significant for DM2 and HLP who presents today for routine followup  Last OV Nov 2020  Had first covid vaccine yesterday - doing well, just a bit sore arm Continues to work on diet, tries to walk at least twice a week Checking fasting ~ 140 Denies any lows Cont to take metformin, farxiga and fish oil - tolerating well   Lab Results  Component Value Date   HGBA1C 9.5 (A) 09/05/2019   HGBA1C 11.2 (A) 06/09/2019   HGBA1C 10.5 (A) 11/05/2018   Lab Results  Component Value Date   LDLCALC 90 09/05/2019   CREATININE 1.10 09/05/2019   Wt Readings from Last 3 Encounters:  01/02/20 278 lb (126.1 kg)  09/05/19 277 lb (125.6 kg)  06/09/19 277 lb 12.8 oz (126 kg)   BP Readings from Last 3 Encounters:  01/02/20 125/88  09/05/19 120/82  06/09/19 130/85    Depression screen [facility] 2/9 01/02/2020 09/05/2019 06/09/2019  Decreased Interest 0 0 0  Down, Depressed, Hopeless 0 0 0  PHQ - 2 Score 0 0 0    Fall Risk  01/02/2020 09/05/2019 06/09/2019 02/04/2019 01/15/2019  Falls in the past year? 0 0 1 0 0  Number falls in past yr: 0 0 0 0 -  Injury with Fall? 0 0 1 0 -  Follow up Falls evaluation completed Falls evaluation completed - - Falls evaluation completed     No Known Allergies  Prior to Admission medications   Medication Sig Start Date End Date Taking? Authorizing Provider  blood glucose meter kit and supplies Dispense based on patient and insurance preference. Use up to four times daily as directed. (FOR ICD-10 E10.9, E11.9). 06/30/19  Yes Rutherford Guys, MD  dapagliflozin propanediol (FARXIGA) 10 MG TABS tablet Take 10 mg by mouth daily. 06/09/19  Yes Rutherford Guys, MD  glucose blood test strip For appropriate meter, check glucose once a day, Dx E11.65 06/09/19  Yes Rutherford Guys, MD  Lancets  (ONETOUCH DELICA PLUS [id number]) MISC USE UP TO FOUR TIMES DAILY AS DIRECTED 07/18/19  Yes [provider]  metFORMIN (GLUCOPHAGE) 1000 MG tablet Take 1 tablet (1,000 mg total) by mouth 2 (two) times daily with a meal. 09/05/19  Yes Rutherford Guys, MD  Omega-3 Fatty Acids (FISH OIL) 1200 MG CPDR Take by mouth.   Yes [provider]    Past Medical History:  Diagnosis Date  . Diabetes mellitus without complication ([place])    TYPE II  . Hyperlipidemia     No past surgical history on file.  Social History   Tobacco Use  . Smoking status: Never Smoker  . Smokeless tobacco: Never Used  Substance Use Topics  . Alcohol use: No    Family History  Problem Relation Age of Onset  . Diabetes Mother   . Stroke Father   . Heart disease Father        HEART ATTACK    Review of Systems  Constitutional: Negative for chills and fever.  Respiratory: Negative for cough and shortness of breath.   Cardiovascular: Negative for chest pain, palpitations and leg swelling.  Gastrointestinal: Negative for abdominal pain, nausea and vomiting.  Genitourinary: Negative for dysuria, frequency and urgency.     OBJECTIVE:  Today's Vitals   01/02/20  0902  BP: 125/88  Pulse: 71  Resp: 18  Temp: 97.8 F (36.6 C)  TempSrc: Oral  SpO2: 97%  Weight: 278 lb (126.1 kg)  Height: '6\' 2"'  (1.88 m)   Body mass index is 35.69 kg/m.   Physical Exam Vitals and nursing note reviewed.  Constitutional:      Appearance: He is well-developed.  HENT:     Head: Normocephalic and atraumatic.  Eyes:     Conjunctiva/sclera: Conjunctivae normal.     Pupils: Pupils are equal, round, and reactive to light.  Cardiovascular:     Rate and Rhythm: Normal rate and regular rhythm.     Heart sounds: No murmur. No friction rub. No gallop.   Pulmonary:     Effort: Pulmonary effort is normal.     Breath sounds: Normal breath sounds. No wheezing or rales.  Musculoskeletal:     Cervical back: Neck  supple.  Skin:    General: Skin is warm and dry.  Neurological:     Mental Status: He is alert and oriented to person, place, and time.     Results for orders placed or performed in visit on 01/02/20 (from the past 24 hour(s))  POCT glycosylated hemoglobin (Hb A1C)     Status: Abnormal   Collection Time: 01/02/20 10:03 AM  Result Value Ref Range   Hemoglobin A1C 10.0 (A) 4.0 - 5.6 %   HbA1c POC (<> result, manual entry)     HbA1c, POC (prediabetic range)     HbA1c, POC (controlled diabetic range)      No results found.   ASSESSMENT and PLAN  1. Uncontrolled type 2 diabetes mellitus with hyperglycemia (HCC) Not controlled. Adding trulicity, reviewed r/se/b. Discussed weight loss.  - POCT glycosylated hemoglobin (Hb A1C) - CMP14+EGFR  2. Hyperlipidemia, unspecified hyperlipidemia type Checking labs today, medications will be adjusted as needed.  - Lipid panel - CMP14+EGFR  Other orders - Omega-3 Fatty Acids (FISH OIL) 1200 MG CPDR; Take by mouth. - Dulaglutide (TRULICITY) 5.67 CS/9.1ZC SOPN; Inject 0.75 mg into the skin once a week.  Return in about 3 months (around 04/03/2020).    Rutherford Guys, MD Primary Care at [place], [place] [address] Ph.  [phone] Fax [phone]

## 2020-01-03 ENCOUNTER — Telehealth: Payer: Self-pay | Admitting: Family Medicine

## 2020-01-03 LAB — CMP14+EGFR
ALT: 27 IU/L (ref 0–44)
AST: 21 IU/L (ref 0–40)
Albumin/Globulin Ratio: 1.7 (ref 1.2–2.2)
Albumin: 4.4 g/dL (ref 3.8–4.9)
Alkaline Phosphatase: 70 IU/L (ref 39–117)
BUN/Creatinine Ratio: 12 (ref 9–20)
BUN: 14 mg/dL (ref 6–24)
Bilirubin Total: 0.6 mg/dL (ref 0.0–1.2)
CO2: 20 mmol/L (ref 20–29)
Calcium: 9.4 mg/dL (ref 8.7–10.2)
Chloride: 103 mmol/L (ref 96–106)
Creatinine, Ser: 1.19 mg/dL (ref 0.76–1.27)
GFR calc Af Amer: 78 mL/min/{1.73_m2} (ref >59)
GFR calc non Af Amer: 68 mL/min/{1.73_m2} (ref >59)
Globulin, Total: 2.6 g/dL (ref 1.5–4.5)
Glucose: 167 mg/dL — ABNORMAL HIGH (ref 65–99)
Potassium: 4.4 mmol/L (ref 3.5–5.2)
Sodium: 139 mmol/L (ref 134–144)
Total Protein: 7 g/dL (ref 6.0–8.5)

## 2020-01-03 LAB — LIPID PANEL
Chol/HDL Ratio: 5.4 ratio — ABNORMAL HIGH (ref 0.0–5.0)
Cholesterol, Total: 195 mg/dL (ref 100–199)
HDL: 36 mg/dL — ABNORMAL LOW (ref >39)
LDL Chol Calc (NIH): 87 mg/dL (ref 0–99)
Triglycerides: 439 mg/dL — ABNORMAL HIGH (ref 0–149)
VLDL Cholesterol Cal: 72 mg/dL — ABNORMAL HIGH (ref 5–40)

## 2020-01-03 NOTE — Telephone Encounter (Signed)
Pt had an apt yesterday on 01/02/20. Pts wife called and is wanting the Lipature medication for cholesterol  that was subscribed to be changed to Vascepa instead. Pt has been on that one before and said it really work for him. If this is possible they are also requesting an coupon code with the proscription.

## 2020-01-06 NOTE — Telephone Encounter (Signed)
No action needed

## 2020-01-15 ENCOUNTER — Other Ambulatory Visit: Payer: Self-pay | Admitting: Family Medicine

## 2020-01-15 NOTE — Telephone Encounter (Signed)
Requested medication (s) are due for refill today:   Requested medication (s) are on the active medication list: no  Last refill:  11/05/18  Future visit scheduled: yes  Notes to clinic:  Prescription ended 12/08/18. Cannot find in chart why med was discontinued.    Requested Prescriptions  Pending Prescriptions Disp Refills   atorvastatin (LIPITOR) 20 MG tablet [Pharmacy Med Name: Atorvastatin Calcium 20 MG Oral Tablet] 90 tablet 0    Sig: Take 1 tablet by mouth once daily      Cardiovascular:  Antilipid - Statins Failed - 01/15/2020  9:23 AM      Failed - LDL in normal range and within 360 days    LDL Chol Calc (NIH)  Date Value Ref Range Status  01/02/2020 87 0 - 99 mg/dL Final          Failed - HDL in normal range and within 360 days    HDL  Date Value Ref Range Status  01/02/2020 36 (L) >39 mg/dL Final          Failed - Triglycerides in normal range and within 360 days    Triglycerides  Date Value Ref Range Status  01/02/2020 439 (H) 0 - 149 mg/dL Final          Passed - Total Cholesterol in normal range and within 360 days    Cholesterol, Total  Date Value Ref Range Status  01/02/2020 195 100 - 199 mg/dL Final          Passed - Patient is not pregnant      Passed - Valid encounter within last 12 months    Recent Outpatient Visits           1 week ago Uncontrolled type 2 diabetes mellitus with hyperglycemia ([place])   [facility], Lilia Argue, MD   4 months ago Uncontrolled type 2 diabetes mellitus with hyperglycemia [facility])   [facility], Lilia Argue, MD   7 months ago Uncontrolled type 2 diabetes mellitus with hyperglycemia [facility])   [facility], Lilia Argue, MD   11 months ago Uncontrolled type 2 diabetes mellitus with hyperglycemia [facility])   [facility], Lilia Argue, MD   11 months ago Tooth pain   [facility], Lilia Argue, MD       Future Appointments             In 2 months  Rutherford Guys, MD Primary Care at [place], [facility]

## 2020-01-16 NOTE — Telephone Encounter (Signed)
Patient's wife request Lipitor 20 mg to be switched to Vascepa instead. Please advice.

## 2020-02-01 ENCOUNTER — Ambulatory Visit: Payer: 59 | Attending: Internal Medicine

## 2020-02-01 DIAGNOSIS — Z23 Encounter for immunization: Secondary | ICD-10-CM

## 2020-02-01 NOTE — Progress Notes (Signed)
   Covid-19 Vaccination Clinic  Name:  LIND BARKMAN    MRN: [id number] DOB: 1966-05-22  02/01/2020  Mr. Mihelic was observed post Covid-19 immunization for 15 minutes without incident. He was provided with Vaccine Information Sheet and instruction to access the V-Safe system.   Mr. Vint was instructed to call 911 with any severe reactions post vaccine: Marland Kitchen Difficulty breathing  . Swelling of face and throat  . A fast heartbeat  . A bad rash all over body  . Dizziness and weakness   Immunizations Administered    Name Date Dose VIS Date Route   Pfizer COVID-19 Vaccine 02/01/2020  8:44 AM 0.3 mL 10/07/2019 Intramuscular   Manufacturer: [place]   Lot: [id number]   [place]: [id number]

## 2020-04-02 ENCOUNTER — Ambulatory Visit: Payer: 59 | Admitting: Family Medicine

## 2020-04-18 ENCOUNTER — Encounter: Payer: Self-pay | Admitting: Family Medicine

## 2020-04-18 ENCOUNTER — Ambulatory Visit (INDEPENDENT_AMBULATORY_CARE_PROVIDER_SITE_OTHER): Payer: 59 | Admitting: Family Medicine

## 2020-04-18 ENCOUNTER — Other Ambulatory Visit: Payer: Self-pay

## 2020-04-18 VITALS — BP 158/83 | HR 91 | Temp 97.7°F | Ht 74.0 in | Wt 279.0 lb

## 2020-04-18 DIAGNOSIS — R21 Rash and other nonspecific skin eruption: Secondary | ICD-10-CM

## 2020-04-18 DIAGNOSIS — B029 Zoster without complications: Secondary | ICD-10-CM

## 2020-04-18 MED ORDER — VALACYCLOVIR HCL 1 G PO TABS: 1000.0000 mg | ORAL_TABLET | Freq: Three times a day (TID) | ORAL | 0 refills | Status: DC

## 2020-04-18 MED ORDER — TRAMADOL HCL 50 MG PO TABS: 50.0000 mg | ORAL_TABLET | Freq: Three times a day (TID) | ORAL | 0 refills | Status: AC | PRN

## 2020-04-18 NOTE — Progress Notes (Signed)
Subjective:  Patient ID: Andrew Reeves, male    DOB: 09-29-1966  Age: 54 y.o. MRN: [id number]  CC:  Chief Complaint  Patient presents with  . possible spider bite    L buttocks and thigh x 3 days ago    HPI Andrew Reeves presents for   Skin lesion on buttocks/left thigh.   Concern for possible spider bite.  Noticed 3 days ago. Initially itching, L buttocks, on inside of upper thigh. Itching and sore. Small pimples on thigh.  No hx of shingles or known shingles vaccine.   Tx: antibiotic ointment - min improvement.  Motrin few times.   Hx of DM,uncontrolled with A1c 10.0 in 01/02/20. trulicity ordered in March - has not started. concnerned about injection. Home readings around 120. No 200's. appt with PCP 7/16.   Immunization History  Administered Date(s) Administered  . Influenza,inj,Quad PF,6+ Mos 09/05/2019  . PFIZER SARS-COV-2 Vaccination 01/01/2020, 02/01/2020  . PPD Test 12/21/2018  . Tdap 10/18/2018    Did not see any spider at the time, saw spider under bed next day in shoe - brown, small.     History Patient Active Problem List   Diagnosis Date Noted  . Hypertriglyceridemia 01/01/2018  . Diabetes ([place]) 03/29/2017   Past Medical History:  Diagnosis Date  . Diabetes mellitus without complication ([place])    TYPE II  . Hyperlipidemia    History reviewed. No pertinent surgical history. No Known Allergies Prior to Admission medications   Medication Sig Start Date End Date Taking? Authorizing Provider  atorvastatin (LIPITOR) 20 MG tablet Take 1 tablet by mouth once daily 01/16/20  Yes Rutherford Guys, MD  blood glucose meter kit and supplies Dispense based on patient and insurance preference. Use up to four times daily as directed. (FOR ICD-10 E10.9, E11.9). 06/30/19  Yes Rutherford Guys, MD  dapagliflozin propanediol (FARXIGA) 10 MG TABS tablet Take 10 mg by mouth daily. 06/09/19  Yes Rutherford Guys, MD  Dulaglutide (TRULICITY) 7.82 NF/6.2ZH SOPN Inject 0.75 mg  into the skin once a week. 01/02/20  Yes Rutherford Guys, MD  glucose blood test strip For appropriate meter, check glucose once a day, Dx E11.65 06/09/19  Yes Rutherford Guys, MD  Lancets (ONETOUCH DELICA PLUS [id number]) MISC USE UP TO FOUR TIMES DAILY AS DIRECTED 07/18/19  Yes [provider]  metFORMIN (GLUCOPHAGE) 1000 MG tablet Take 1 tablet (1,000 mg total) by mouth 2 (two) times daily with a meal. 09/05/19  Yes Rutherford Guys, MD  Omega-3 Fatty Acids (FISH OIL) 1200 MG CPDR Take by mouth.   Yes [provider]  atorvastatin (LIPITOR) 20 MG tablet Take 1 tablet (20 mg total) by mouth daily. Patient not taking: Reported on 12/08/2018 11/05/18   Rutherford Guys, MD   Social History   Socioeconomic History  . Marital status: Married    Spouse name: Not on file  . Number of children: 4  . Years of education: Not on file  . Highest education level: Not on file  Occupational History  . Not on file  Tobacco Use  . Smoking status: Never Smoker  . Smokeless tobacco: Never Used  Vaping Use  . Vaping Use: Never used  Substance and Sexual Activity  . Alcohol use: No  . Drug use: No  . Sexual activity: Yes  Other Topics Concern  . Not on file  Social History Narrative  . Not on file   Social Determinants of Health  Financial Resource Strain:   . Difficulty of Paying Living Expenses:   Food Insecurity:   . Worried About Charity fundraiser in the Last Year:   . Arboriculturist in the Last Year:   Transportation Needs:   . Film/video editor (Medical):   Marland Kitchen Lack of Transportation (Non-Medical):   Physical Activity:   . Days of Exercise per Week:   . Minutes of Exercise per Session:   Stress:   . Feeling of Stress :   Social Connections:   . Frequency of Communication with Friends and Family:   . Frequency of Social Gatherings with Friends and Family:   . Attends Religious Services:   . Active Member of Clubs or Organizations:   . Attends Theatre manager Meetings:   Marland Kitchen Marital Status:   Intimate Partner Violence:   . Fear of Current or Ex-Partner:   . Emotionally Abused:   Marland Kitchen Physically Abused:   . Sexually Abused:     Review of Systems Per HPI.   Objective:   Vitals:   04/18/20 1034  BP: (!) 158/83  Pulse: 91  Temp: 97.7 F (36.5 C)  SpO2: 95%  Weight: 279 lb (126.6 kg)  Height: _0  (1.88 m)     Physical Exam Vitals reviewed.  Constitutional:      General: He is not in acute distress.    Appearance: He is well-developed.  HENT:     Head: Normocephalic and atraumatic.  Cardiovascular:     Rate and Rhythm: Normal rate.  Pulmonary:     Effort: Pulmonary effort is normal.  Skin:    Comments: Patch on lateral buttocks and anterior upper thigh with overlying vesicles. See photo.   Neurological:     Mental Status: He is alert and oriented to person, place, and time.         Assessment & Plan:  Andrew Reeves is a 54 y.o. male . Herpes zoster without complication - Plan: valACYclovir (VALTREX) 1000 MG tablet, traMADol (ULTRAM) 50 MG tablet  Rash and nonspecific skin eruption - Plan: valACYclovir (VALTREX) 1000 MG tablet, traMADol (ULTRAM) 50 MG tablet  Suspected herpes zoster, possible mid lower lumbar nerve dermatome.  Start Valtrex.  Tramadol if needed for pain,  side effects discussed.  Denies history of seizure.  Elevated blood pressure may be in part related pain.  Follow-up as planned with primary care provider next month with RTC precautions given.   Meds ordered this encounter  Medications  . valACYclovir (VALTREX) 1000 MG tablet    Sig: Take 1 tablet (1,000 mg total) by mouth 3 (three) times daily.    Dispense:  21 tablet    Refill:  0  . traMADol (ULTRAM) 50 MG tablet    Sig: Take 1 tablet (50 mg total) by mouth every 8 (eight) hours as needed for up to 5 days.    Dispense:  15 tablet    Refill:  0   Patient Instructions   Unfortunately rash is likely due to shingles, not spider  bite.  Start antiviral Valtrex as soon as possible today.  Take 3 pills a day for 1 week.  Episodic Motrin is okay, but I did write for some tramadol if needed for more severe pain in those areas.  Keep areas clean and covered.  See information below.  Keep follow-up with primary care provider next month but I am happy to see you if any worsening symptoms sooner.   Shingles  Shingles, which is also known as herpes zoster, is an infection that causes a painful skin rash and fluid-filled blisters. It is caused by a virus. Shingles only develops in people who:  Have had chickenpox.  Have been given a medicine to protect against chickenpox (have been vaccinated). Shingles is rare in this group. What are the causes? Shingles is caused by varicella-zoster virus (VZV). This is the same virus that causes chickenpox. After a person is exposed to VZV, the virus stays in the body in an inactive (dormant) state. Shingles develops if the virus is reactivated. This can happen many years after the first (initial) exposure to VZV. It is not known what causes this virus to be reactivated. What increases the risk? People who have had chickenpox or received the chickenpox vaccine are at risk for shingles. Shingles infection is more common in people who:  Are older than age 52.  Have a weakened disease-fighting system (immune system), such as people with: ? HIV. ? AIDS. ? Cancer.  Are taking medicines that weaken the immune system, such as transplant medicines.  Are experiencing a lot of stress. What are the signs or symptoms? Early symptoms of this condition include itching, tingling, and pain in an area on your skin. Pain may be described as burning, stabbing, or throbbing. A few days or weeks after early symptoms start, a painful red rash appears. The rash is usually on one side of the body and has a band-like or belt-like pattern. The rash eventually turns into fluid-filled blisters that break open,  change into scabs, and dry up in about 2-3 weeks. At any time during the infection, you may also develop:  A fever.  Chills.  A headache.  An upset stomach. How is this diagnosed? This condition is diagnosed with a skin exam. Skin or fluid samples may be taken from the blisters before a diagnosis is made. These samples are examined under a microscope or sent to a lab for testing. How is this treated? The rash may last for several weeks. There is not a specific cure for this condition. Your health care provider will probably prescribe medicines to help you manage pain, recover more quickly, and avoid long-term problems. Medicines may include:  Antiviral drugs.  Anti-inflammatory drugs.  Pain medicines.  Anti-itching medicines (antihistamines). If the area involved is on your face, you may be referred to a specialist, such as an eye doctor (ophthalmologist) or an ear, nose, and throat (ENT) doctor (otolaryngologist) to help you avoid eye problems, chronic pain, or disability. Follow these instructions at home: Medicines  Take over-the-counter and prescription medicines only as told by your health care provider.  Apply an anti-itch cream or numbing cream to the affected area as told by your health care provider. Relieving itching and discomfort   Apply cold, wet cloths (cold compresses) to the area of the rash or blisters as told by your health care provider.  Cool baths can be soothing. Try adding baking soda or dry oatmeal to the water to reduce itching. Do not bathe in hot water. Blister and rash care  Keep your rash covered with a loose bandage (dressing). Wear loose-fitting clothing to help ease the pain of material rubbing against the rash.  Keep your rash and blisters clean by washing the area with mild soap and cool water as told by your health care provider.  Check your rash every day for signs of infection. Check for: ? More redness, swelling, or pain. ? Fluid or  blood. ? Warmth. ? Pus or a bad smell.  Do not scratch your rash or pick at your blisters. To help avoid scratching: ? Keep your fingernails clean and cut short. ? Wear gloves or mittens while you sleep, if scratching is a problem. General instructions  Rest as told by your health care provider.  Keep all follow-up visits as told by your health care provider. This is important.  Wash your hands often with soap and water. If soap and water are not available, use hand sanitizer. Doing this lowers your chance of getting a bacterial skin infection.  Before your blisters change into scabs, your shingles infection can cause chickenpox in people who have never had it or have never been vaccinated against it. To prevent this from happening, avoid contact with other people, especially: ? Babies. ? Pregnant women. ? Children who have eczema. ? Elderly people who have transplants. ? People who have chronic illnesses, such as cancer or AIDS. Contact a health care provider if:  Your pain is not relieved with prescribed medicines.  Your pain does not get better after the rash heals.  You have signs of infection in the rash area, such as: ? More redness, swelling, or pain around the rash. ? Fluid or blood coming from the rash. ? The rash area feeling warm to the touch. ? Pus or a bad smell coming from the rash. Get help right away if:  The rash is on your face or nose.  You have facial pain, pain around your eye area, or loss of feeling on one side of your face.  You have difficulty seeing.  You have ear pain or have ringing in your ear.  You have a loss of taste.  Your condition gets worse. Summary  Shingles, which is also known as herpes zoster, is an infection that causes a painful skin rash and fluid-filled blisters.  This condition is diagnosed with a skin exam. Skin or fluid samples may be taken from the blisters and examined before the diagnosis is made.  Keep your rash  covered with a loose bandage (dressing). Wear loose-fitting clothing to help ease the pain of material rubbing against the rash.  Before your blisters change into scabs, your shingles infection can cause chickenpox in people who have never had it or have never been vaccinated against it. This information is not intended to replace advice given to you by your health care provider. Make sure you discuss any questions you have with your health care provider. Document Revised: 02/04/2019 Document Reviewed: 06/17/2017 Elsevier Patient Education  [employer].   If you have lab work done today you will be contacted with your lab results within the next 2 weeks.  If you have not heard from [place] then please contact us. The fastest way to get your results is to register for My Chart.   IF you received an x-ray today, you will receive an invoice from [facility]. Please contact [facility] at [phone] with questions or concerns regarding your invoice.   IF you received labwork today, you will receive an invoice from [facility]. Please contact [facility] at [phone] with questions or concerns regarding your invoice.   Our billing staff will not be able to assist you with questions regarding bills from these companies.  You will be contacted with the lab results as soon as they are available. The fastest way to get your results is to activate your My Chart account. Instructions are located on the last  page of this paperwork. If you have not heard from [place] regarding the results in 2 weeks, please contact this office.          Signed, Merri Ray, MD [facility]

## 2020-04-18 NOTE — Patient Instructions (Addendum)
Unfortunately rash is likely due to shingles, not spider bite.  Start antiviral Valtrex as soon as possible today.  Take 3 pills a day for 1 week.  Episodic Motrin is okay, but I did write for some tramadol if needed for more severe pain in those areas.  Keep areas clean and covered.  See information below.  Keep follow-up with primary care provider next month but I am happy to see you if any worsening symptoms sooner.   Shingles  Shingles, which is also known as herpes zoster, is an infection that causes a painful skin rash and fluid-filled blisters. It is caused by a virus. Shingles only develops in people who:  Have had chickenpox.  Have been given a medicine to protect against chickenpox (have been vaccinated). Shingles is rare in this group. What are the causes? Shingles is caused by varicella-zoster virus (VZV). This is the same virus that causes chickenpox. After a person is exposed to VZV, the virus stays in the body in an inactive (dormant) state. Shingles develops if the virus is reactivated. This can happen many years after the first (initial) exposure to VZV. It is not known what causes this virus to be reactivated. What increases the risk? People who have had chickenpox or received the chickenpox vaccine are at risk for shingles. Shingles infection is more common in people who:  Are older than age 70.  Have a weakened disease-fighting system (immune system), such as people with: ? HIV. ? AIDS. ? Cancer.  Are taking medicines that weaken the immune system, such as transplant medicines.  Are experiencing a lot of stress. What are the signs or symptoms? Early symptoms of this condition include itching, tingling, and pain in an area on your skin. Pain may be described as burning, stabbing, or throbbing. A few days or weeks after early symptoms start, a painful red rash appears. The rash is usually on one side of the body and has a band-like or belt-like pattern. The rash  eventually turns into fluid-filled blisters that break open, change into scabs, and dry up in about 2-3 weeks. At any time during the infection, you may also develop:  A fever.  Chills.  A headache.  An upset stomach. How is this diagnosed? This condition is diagnosed with a skin exam. Skin or fluid samples may be taken from the blisters before a diagnosis is made. These samples are examined under a microscope or sent to a lab for testing. How is this treated? The rash may last for several weeks. There is not a specific cure for this condition. Your health care provider will probably prescribe medicines to help you manage pain, recover more quickly, and avoid long-term problems. Medicines may include:  Antiviral drugs.  Anti-inflammatory drugs.  Pain medicines.  Anti-itching medicines (antihistamines). If the area involved is on your face, you may be referred to a specialist, such as an eye doctor (ophthalmologist) or an ear, nose, and throat (ENT) doctor (otolaryngologist) to help you avoid eye problems, chronic pain, or disability. Follow these instructions at home: Medicines  Take over-the-counter and prescription medicines only as told by your health care provider.  Apply an anti-itch cream or numbing cream to the affected area as told by your health care provider. Relieving itching and discomfort   Apply cold, wet cloths (cold compresses) to the area of the rash or blisters as told by your health care provider.  Cool baths can be soothing. Try adding baking soda or dry oatmeal to the  water to reduce itching. Do not bathe in hot water. Blister and rash care  Keep your rash covered with a loose bandage (dressing). Wear loose-fitting clothing to help ease the pain of material rubbing against the rash.  Keep your rash and blisters clean by washing the area with mild soap and cool water as told by your health care provider.  Check your rash every day for signs of infection.  Check for: ? More redness, swelling, or pain. ? Fluid or blood. ? Warmth. ? Pus or a bad smell.  Do not scratch your rash or pick at your blisters. To help avoid scratching: ? Keep your fingernails clean and cut short. ? Wear gloves or mittens while you sleep, if scratching is a problem. General instructions  Rest as told by your health care provider.  Keep all follow-up visits as told by your health care provider. This is important.  Wash your hands often with soap and water. If soap and water are not available, use hand sanitizer. Doing this lowers your chance of getting a bacterial skin infection.  Before your blisters change into scabs, your shingles infection can cause chickenpox in people who have never had it or have never been vaccinated against it. To prevent this from happening, avoid contact with other people, especially: ? Babies. ? Pregnant women. ? Children who have eczema. ? Elderly people who have transplants. ? People who have chronic illnesses, such as cancer or AIDS. Contact a health care provider if:  Your pain is not relieved with prescribed medicines.  Your pain does not get better after the rash heals.  You have signs of infection in the rash area, such as: ? More redness, swelling, or pain around the rash. ? Fluid or blood coming from the rash. ? The rash area feeling warm to the touch. ? Pus or a bad smell coming from the rash. Get help right away if:  The rash is on your face or nose.  You have facial pain, pain around your eye area, or loss of feeling on one side of your face.  You have difficulty seeing.  You have ear pain or have ringing in your ear.  You have a loss of taste.  Your condition gets worse. Summary  Shingles, which is also known as herpes zoster, is an infection that causes a painful skin rash and fluid-filled blisters.  This condition is diagnosed with a skin exam. Skin or fluid samples may be taken from the blisters and  examined before the diagnosis is made.  Keep your rash covered with a loose bandage (dressing). Wear loose-fitting clothing to help ease the pain of material rubbing against the rash.  Before your blisters change into scabs, your shingles infection can cause chickenpox in people who have never had it or have never been vaccinated against it. This information is not intended to replace advice given to you by your health care provider. Make sure you discuss any questions you have with your health care provider. Document Revised: 02/04/2019 Document Reviewed: 06/17/2017 Elsevier Patient Education  [employer].   If you have lab work done today you will be contacted with your lab results within the next 2 weeks.  If you have not heard from [place] then please contact us. The fastest way to get your results is to register for My Chart.   IF you received an x-ray today, you will receive an invoice from [facility]. Please contact [facility] at [phone] with questions or  concerns regarding your invoice.   IF you received labwork today, you will receive an invoice from [facility]. Please contact [facility] at [phone] with questions or concerns regarding your invoice.   Our billing staff will not be able to assist you with questions regarding bills from these companies.  You will be contacted with the lab results as soon as they are available. The fastest way to get your results is to activate your My Chart account. Instructions are located on the last page of this paperwork. If you have not heard from [place] regarding the results in 2 weeks, please contact this office.

## 2020-04-20 ENCOUNTER — Ambulatory Visit: Payer: 59 | Admitting: Registered Nurse

## 2020-05-11 ENCOUNTER — Ambulatory Visit: Payer: 59 | Admitting: Family Medicine

## 2020-06-12 ENCOUNTER — Other Ambulatory Visit: Payer: Self-pay | Admitting: Family Medicine

## 2020-06-12 NOTE — Telephone Encounter (Signed)
Requested Prescriptions  Pending Prescriptions Disp Refills   FARXIGA 10 MG TABS tablet [Pharmacy Med Name: Farxiga 10 MG Oral Tablet] 15 tablet 0    Sig: Take 1 tablet by mouth once daily     Endocrinology:  Diabetes - SGLT2 Inhibitors Failed - 06/12/2020 10:47 AM      Failed - LDL in normal range and within 360 days    LDL Chol Calc (NIH)  Date Value Ref Range Status  01/02/2020 87 0 - 99 mg/dL Final         Failed - HBA1C is between 0 and 7.9 and within 180 days    Hemoglobin A1C  Date Value Ref Range Status  01/02/2020 10.0 (A) 4.0 - 5.6 % Final         Passed - Cr in normal range and within 360 days    Creatinine, Ser  Date Value Ref Range Status  01/02/2020 1.19 0.76 - 1.27 mg/dL Final         Passed - eGFR in normal range and within 360 days    GFR calc Af Amer  Date Value Ref Range Status  01/02/2020 78 >59 mL/min/1.73 Final   GFR calc non Af Amer  Date Value Ref Range Status  01/02/2020 68 >59 mL/min/1.73 Final         Passed - Valid encounter within last 6 months    Recent Outpatient Visits          1 month ago Herpes zoster without complication   [facility], Ranell Patrick, MD   5 months ago Uncontrolled type 2 diabetes mellitus with hyperglycemia [facility])   [facility], Lilia Argue, MD   9 months ago Uncontrolled type 2 diabetes mellitus with hyperglycemia [facility])   [facility], Lilia Argue, MD   1 year ago Uncontrolled type 2 diabetes mellitus with hyperglycemia [facility])   [facility], Lilia Argue, MD   1 year ago Uncontrolled type 2 diabetes mellitus with hyperglycemia [facility])   [facility], Lilia Argue, MD      Future Appointments            In 3 days Rutherford Guys, MD [facility] [place]           courtesy refill Patient has appointment 06/15/20 Will need labs.

## 2020-06-15 ENCOUNTER — Telehealth (INDEPENDENT_AMBULATORY_CARE_PROVIDER_SITE_OTHER): Payer: 59 | Admitting: Family Medicine

## 2020-06-15 ENCOUNTER — Other Ambulatory Visit: Payer: Self-pay

## 2020-06-15 ENCOUNTER — Ambulatory Visit: Payer: 59 | Admitting: Family Medicine

## 2020-06-15 ENCOUNTER — Encounter: Payer: Self-pay | Admitting: Family Medicine

## 2020-06-15 DIAGNOSIS — Z23 Encounter for immunization: Secondary | ICD-10-CM

## 2020-06-15 DIAGNOSIS — E785 Hyperlipidemia, unspecified: Secondary | ICD-10-CM | POA: Diagnosis not present

## 2020-06-15 DIAGNOSIS — E1165 Type 2 diabetes mellitus with hyperglycemia: Secondary | ICD-10-CM

## 2020-06-15 MED ORDER — BLOOD GLUCOSE METER KIT: PACK | 0 refills | Status: DC

## 2020-06-15 MED ORDER — DAPAGLIFLOZIN PROPANEDIOL 10 MG PO TABS
10.0000 mg | ORAL_TABLET | Freq: Every day | ORAL | 1 refills | Status: DC
Start: 2020-06-15 — End: 2020-12-06

## 2020-06-15 MED ORDER — METFORMIN HCL 1000 MG PO TABS: 1000.0000 mg | ORAL_TABLET | Freq: Two times a day (BID) | ORAL | 3 refills | Status: DC

## 2020-06-15 MED ORDER — ONETOUCH DELICA PLUS LANCET33G MISC: 11 refills | Status: DC

## 2020-06-15 MED ORDER — GLUCOSE BLOOD VI STRP: ORAL_STRIP | 12 refills | Status: DC

## 2020-06-15 MED ORDER — METFORMIN HCL 1000 MG PO TABS: 1000.0000 mg | ORAL_TABLET | Freq: Two times a day (BID) | ORAL | 1 refills | Status: DC

## 2020-06-15 NOTE — Progress Notes (Signed)
Virtual Visit Note  I connected with patient on 06/15/20 at 450pm by video doximity  and verified that I am speaking with the correct person using two identifiers. Andrew Reeves is currently located at home and patient is currently with them during visit. The provider, Rutherford Guys, MD is located in their office at time of visit.  I discussed the limitations, risks, security and privacy concerns of performing an evaluation and management service by telephone and the availability of in person appointments. I also discussed with the patient that there may be a patient responsible charge related to this service. The patient expressed understanding and agreed to proceed.   I provided 10 minutes of non-face-to-face time during this encounter.  Chief Complaint  Patient presents with  . Diabetes    labs have been pended for future draw. Also wants the zoster and p 23, has been pended for future  . Medication Refill  . quarantine    pt is currently on quarantine, daughter's roommate positive of covid. Recently exposed to roommate, no sx    HPI ? Last OV March 3976 - started trulicity  He is taking metformin, farxiga for his DM trulicity is too expensive He has been walking daily, ~ 2 miles Has cut back sweets and french fries He has lost about 9 lbs, today he weighed 269 lbs He has seen eye provider at [facility] on [address], ~ 2 weeks ago He is taking fish oil for HLP His wife checks his BP, says it "good" He was seen in June for shingles He has been exposed to his daughter's roommate whom is covid positive, he has no symptoms, he has been fully vaccinated  Lab Results  Component Value Date   HGBA1C 10.0 (A) 01/02/2020   HGBA1C 9.5 (A) 09/05/2019   HGBA1C 11.2 (A) 06/09/2019   Lab Results  Component Value Date   LDLCALC 87 01/02/2020   CREATININE 1.19 01/02/2020   Wt Readings from Last 3 Encounters:  04/18/20 279 lb (126.6 kg)  01/02/20 278 lb (126.1 kg)  09/05/19 277 lb  (125.6 kg)    No Known Allergies  Prior to Admission medications   Medication Sig Start Date End Date Taking? Authorizing Provider  atorvastatin (LIPITOR) 20 MG tablet Take 1 tablet by mouth once daily 01/16/20   Rutherford Guys, MD  blood glucose meter kit and supplies Dispense based on patient and insurance preference. Use up to four times daily as directed. (FOR ICD-10 E10.9, E11.9). 06/15/20   Rutherford Guys, MD  Dulaglutide (TRULICITY) 7.34 LP/3.7TK SOPN Inject 0.75 mg into the skin once a week. 01/02/20   Rutherford Guys, MD  FARXIGA 10 MG TABS tablet Take 1 tablet by mouth once daily 06/12/20   Rutherford Guys, MD  glucose blood test strip For appropriate meter, check glucose once a day, Dx E11.65 06/15/20   Rutherford Guys, MD  Lancets (ONETOUCH DELICA PLUS [id number]) MISC USE UP TO FOUR TIMES DAILY AS DIRECTED 06/15/20   Rutherford Guys, MD  metFORMIN (GLUCOPHAGE) 1000 MG tablet Take 1 tablet (1,000 mg total) by mouth 2 (two) times daily with a meal. 06/15/20   Rutherford Guys, MD  Omega-3 Fatty Acids (FISH OIL) 1200 MG CPDR Take by mouth.    [provider]  valACYclovir (VALTREX) 1000 MG tablet Take 1 tablet (1,000 mg total) by mouth 3 (three) times daily. 04/18/20   Wendie Agreste, MD  atorvastatin (LIPITOR) 20 MG tablet Take 1  tablet (20 mg total) by mouth daily. Patient not taking: Reported on 12/08/2018 11/05/18   Rutherford Guys, MD    Past Medical History:  Diagnosis Date  . Diabetes mellitus without complication ([place])    TYPE II  . Hyperlipidemia     No past surgical history on file.  Social History   Tobacco Use  . Smoking status: Never Smoker  . Smokeless tobacco: Never Used  Substance Use Topics  . Alcohol use: No    Family History  Problem Relation Age of Onset  . Diabetes Mother   . Stroke Father   . Heart disease Father        HEART ATTACK    Review of Systems  Constitutional: Negative for chills and fever.  Eyes: Negative for blurred  vision and double vision.  Respiratory: Negative for cough and shortness of breath.   Cardiovascular: Negative for chest pain, palpitations and leg swelling.  Gastrointestinal: Negative for abdominal pain, nausea and vomiting.  Genitourinary: Negative for dysuria, frequency and urgency.  Neurological: Negative for tingling.    Objective  Vitals as reported by the patient: none  GEN: AAOx3, NAD HEENT: [place]/AT, pupils are symmetrical, EOMI, non-icteric sclera Resp: breathing comfortably, speaking in full sentences Skin: no rashes noted, no pallor Psych: good eye contact, normal mood and affect   ASSESSMENT and PLAN  1. Uncontrolled type 2 diabetes mellitus with hyperglycemia ([place]) Checking labs today, medications will be adjusted as needed. Continue working on LFM - blood glucose meter kit and supplies; Dispense based on patient and insurance preference. Use up to four times daily as directed. (FOR ICD-10 E10.9, E11.9). - Hemoglobin A1c; Future - Lipid panel; Future - CMP14+EGFR; Future - Microalbumin / creatinine urine ratio; Future - Lancets (ONETOUCH DELICA PLUS [id number]) MISC; USE UP TO FOUR TIMES DAILY AS DIRECTED - glucose blood test strip; For appropriate meter, check glucose once a day, Dx E11.65 - metFORMIN (GLUCOPHAGE) 1000 MG tablet; Take 1 tablet (1,000 mg total) by mouth 2 (two) times daily with a meal.  2. Hyperlipidemia, unspecified hyperlipidemia type Checking labs today, medications will be adjusted as needed.  - Lipid panel; Future - CMP14+EGFR; Future  3. Need for vaccination Can have both done as nurse visit when he comes in for labs - Pneumococcal polysaccharide vaccine 23-valent greater than or equal to 2yo subcutaneous/IM; Future - shingrix  Other orders - dapagliflozin propanediol (FARXIGA) 10 MG TABS tablet; Take 1 tablet (10 mg total) by mouth daily.  FOLLOW-UP: 3 months   The above assessment and management plan was discussed with the patient. The  patient verbalized understanding of and has agreed to the management plan. Patient is aware to call the clinic if symptoms persist or worsen. Patient is aware when to return to the clinic for a follow-up visit. Patient educated on when it is appropriate to go to the emergency department.     Rutherford Guys, MD [facility] [place],  [address] Ph.  [phone] Fax [phone]

## 2020-06-26 ENCOUNTER — Other Ambulatory Visit: Payer: Self-pay | Admitting: Family Medicine

## 2020-07-06 ENCOUNTER — Ambulatory Visit: Payer: Self-pay

## 2020-07-20 ENCOUNTER — Ambulatory Visit (INDEPENDENT_AMBULATORY_CARE_PROVIDER_SITE_OTHER): Payer: 59 | Admitting: Family Medicine

## 2020-07-20 ENCOUNTER — Other Ambulatory Visit: Payer: Self-pay

## 2020-07-20 DIAGNOSIS — E785 Hyperlipidemia, unspecified: Secondary | ICD-10-CM | POA: Diagnosis not present

## 2020-07-20 DIAGNOSIS — Z23 Encounter for immunization: Secondary | ICD-10-CM

## 2020-07-20 DIAGNOSIS — E1165 Type 2 diabetes mellitus with hyperglycemia: Secondary | ICD-10-CM

## 2020-07-21 LAB — CMP14+EGFR
ALT: 26 IU/L (ref 0–44)
AST: 22 IU/L (ref 0–40)
Albumin/Globulin Ratio: 1.7 (ref 1.2–2.2)
Albumin: 4.3 g/dL (ref 3.8–4.9)
Alkaline Phosphatase: 70 IU/L (ref 44–121)
BUN/Creatinine Ratio: 11 (ref 9–20)
BUN: 13 mg/dL (ref 6–24)
Bilirubin Total: 0.6 mg/dL (ref 0.0–1.2)
CO2: 23 mmol/L (ref 20–29)
Calcium: 9 mg/dL (ref 8.7–10.2)
Chloride: 102 mmol/L (ref 96–106)
Creatinine, Ser: 1.15 mg/dL (ref 0.76–1.27)
GFR calc Af Amer: 82 mL/min/{1.73_m2} (ref >59)
GFR calc non Af Amer: 71 mL/min/{1.73_m2} (ref >59)
Globulin, Total: 2.6 g/dL (ref 1.5–4.5)
Glucose: 170 mg/dL — ABNORMAL HIGH (ref 65–99)
Potassium: 4.2 mmol/L (ref 3.5–5.2)
Sodium: 139 mmol/L (ref 134–144)
Total Protein: 6.9 g/dL (ref 6.0–8.5)

## 2020-07-21 LAB — LIPID PANEL
Chol/HDL Ratio: 5.9 ratio — ABNORMAL HIGH (ref 0.0–5.0)
Cholesterol, Total: 207 mg/dL — ABNORMAL HIGH (ref 100–199)
HDL: 35 mg/dL — ABNORMAL LOW (ref >39)
LDL Chol Calc (NIH): 83 mg/dL (ref 0–99)
Triglycerides: 553 mg/dL (ref 0–149)
VLDL Cholesterol Cal: 89 mg/dL — ABNORMAL HIGH (ref 5–40)

## 2020-07-21 LAB — HEMOGLOBIN A1C
Est. average glucose Bld gHb Est-mCnc: 229 mg/dL
Hgb A1c MFr Bld: 9.6 % — ABNORMAL HIGH (ref 4.8–5.6)

## 2020-07-23 ENCOUNTER — Telehealth: Payer: Self-pay | Admitting: Family Medicine

## 2020-07-23 NOTE — Telephone Encounter (Signed)
Pt would like a cb concerning his most recent lab results. Please advise at [phone].

## 2020-07-23 NOTE — Telephone Encounter (Signed)
Pt will get a call once evaluation is ready

## 2020-07-24 ENCOUNTER — Other Ambulatory Visit: Payer: Self-pay | Admitting: Family Medicine

## 2020-07-24 MED ORDER — GLIMEPIRIDE 4 MG PO TABS: 4.0000 mg | ORAL_TABLET | Freq: Every day | ORAL | 1 refills | Status: DC

## 2020-07-24 MED ORDER — ATORVASTATIN CALCIUM 20 MG PO TABS
20.0000 mg | ORAL_TABLET | Freq: Every day | ORAL | 3 refills | Status: DC
Start: 2020-07-24 — End: 2020-07-31

## 2020-07-26 ENCOUNTER — Telehealth: Payer: Self-pay

## 2020-07-26 NOTE — Telephone Encounter (Signed)
Pt.'s wife called to inquire if the Dr. Intended the pt. To take all of the following medications; atorvastatin, meFORMIN, Glimepiride, and Farxiga. Or if the pt. Needed to make any changes to how they were taking the medication. Pt. Reports stable BS, occasionally running "a little low" and needing correction by eating a small sweet.

## 2020-07-30 ENCOUNTER — Telehealth: Payer: Self-pay | Admitting: Family Medicine

## 2020-07-30 NOTE — Telephone Encounter (Signed)
Patients wif e is calling and patient is requesting vascepa instead of the atorvastatin (LIPITOR) 20 MG tablet [[id number]  Patient does not like the way  it makes him feel   Please adivse

## 2020-07-30 NOTE — Telephone Encounter (Signed)
Pt doesn't like the way the Atorvastatin makes him feel, pt feels drowsy after taking pt reports same issue when he has tried this before, Pt hesitant to take at nigh as he takes multiple other at night would rather an alternative Vascepa as he continues to go back to this from several other cholesterol meds. Okay to start back on this?

## 2020-07-31 MED ORDER — ROSUVASTATIN CALCIUM 5 MG PO TABS: 5.0000 mg | ORAL_TABLET | Freq: Every day | ORAL | 3 refills | Status: DC

## 2020-07-31 NOTE — Telephone Encounter (Signed)
Informed pt he voiced understanding and agreed

## 2020-07-31 NOTE — Telephone Encounter (Signed)
Please let patient know that I would like for him to try a different cholesterol medication. I sent in a prescription for crestor 5mg  daily. thanks

## 2020-08-23 ENCOUNTER — Other Ambulatory Visit: Payer: Self-pay

## 2020-08-23 DIAGNOSIS — E1165 Type 2 diabetes mellitus with hyperglycemia: Secondary | ICD-10-CM

## 2020-08-23 MED ORDER — BLOOD GLUCOSE METER KIT
PACK | 0 refills | Status: AC
Start: 2020-08-23 — End: ?

## 2020-08-23 MED ORDER — GLUCOSE BLOOD VI STRP: ORAL_STRIP | 11 refills | Status: DC

## 2020-08-31 ENCOUNTER — Telehealth: Payer: Self-pay | Admitting: Family Medicine

## 2020-08-31 ENCOUNTER — Other Ambulatory Visit: Payer: Self-pay

## 2020-08-31 ENCOUNTER — Encounter: Payer: Self-pay | Admitting: Family Medicine

## 2020-08-31 ENCOUNTER — Ambulatory Visit (INDEPENDENT_AMBULATORY_CARE_PROVIDER_SITE_OTHER): Payer: 59 | Admitting: Family Medicine

## 2020-08-31 VITALS — BP 130/80 | HR 75 | Temp 98.2°F | Ht 73.0 in | Wt 283.8 lb

## 2020-08-31 DIAGNOSIS — E1165 Type 2 diabetes mellitus with hyperglycemia: Secondary | ICD-10-CM | POA: Diagnosis not present

## 2020-08-31 DIAGNOSIS — E785 Hyperlipidemia, unspecified: Secondary | ICD-10-CM | POA: Diagnosis not present

## 2020-08-31 DIAGNOSIS — Z23 Encounter for immunization: Secondary | ICD-10-CM

## 2020-08-31 LAB — POCT GLYCOSYLATED HEMOGLOBIN (HGB A1C): Hemoglobin A1C: 8.7 % — AB (ref 4.0–5.6)

## 2020-08-31 NOTE — Patient Instructions (Addendum)
   Continue same medications for now, recheck in 2 months.  I will let you know if any changes needed based on your bloodwork.  Thanks for coming in today.  If you have lab work done today you will be contacted with your lab results within the next 2 weeks.  If you have not heard from [place] then please contact us. The fastest way to get your results is to register for My Chart.   IF you received an x-ray today, you will receive an invoice from [facility]. Please contact [facility] at [phone] with questions or concerns regarding your invoice.   IF you received labwork today, you will receive an invoice from [facility]. Please contact [facility] at [phone] with questions or concerns regarding your invoice.   Our billing staff will not be able to assist you with questions regarding bills from these companies.  You will be contacted with the lab results as soon as they are available. The fastest way to get your results is to activate your My Chart account. Instructions are located on the last page of this paperwork. If you have not heard from [place] regarding the results in 2 weeks, please contact this office.

## 2020-08-31 NOTE — Telephone Encounter (Signed)
Spoke with clinical and they will try to add on for [facility]   Pt also wants to let us know that He had Phizer booster shot at [facility] on 08/09/2020 and flu shot the same day

## 2020-08-31 NOTE — Progress Notes (Signed)
Subjective:  Patient ID: Andrew Reeves, male    DOB: 1966-08-20  Age: 54 y.o. MRN: [id number]  CC:  Chief Complaint  Patient presents with  . Transitions Of Care    3 m f/u   . Diabetes    HPI Andrew Reeves presents for  Transition of care, previous primary care provider Dr. Pamella Pert.  I did see him in June for acute issue.  Diabetes: Complicated by hyperglycemia, Trulicity was ordered in March, had not yet started at his June visit.  Last visit for diabetes August 20.  Taking Farxiga, Metformin at that time.  Trulicity was cost prohibitive.  He was walking daily at that time.  A1c elevated, added glimepiride 4 mg daily.  Continued exercise, weight loss recommended. No new side effects with current meds.  Home readings:  Fasting 120-130 Postprandial - none.  No symptomatic lows.  No 200's.   Microalbumin: due Optho, foot exam, pneumovax: Ophthalmology eval in September. Fish oil for hyperlipidemia, started on statin in August.  Crestor 53m qd.   Lab Results  Component Value Date   HGBA1C 9.6 (H) 07/20/2020   HGBA1C 10.0 (A) 01/02/2020   HGBA1C 9.5 (A) 09/05/2019   Lab Results  Component Value Date   LDLCALC 83 07/20/2020   CREATININE 1.15 07/20/2020   [place] ago when received covid booster.  Pneumovax today.   Hyperlipidemia: Note adjustment of Crestor 5 mg ordered October 4. Off fish oil.  No new side effects/myalgias.  Lab Results  Component Value Date   CHOL 207 (H) 07/20/2020   HDL 35 (L) 07/20/2020   LDLCALC 83 07/20/2020   TRIG 553 (HH) 07/20/2020   CHOLHDL 5.9 (H) 07/20/2020   Lab Results  Component Value Date   ALT 26 07/20/2020   AST 22 07/20/2020   ALKPHOS 70 07/20/2020   BILITOT 0.6 07/20/2020     History Patient Active Problem List   Diagnosis Date Noted  . Hypertriglyceridemia 01/01/2018  . Diabetes ([place] 03/29/2017   Past Medical History:  Diagnosis Date  . Diabetes mellitus without complication ([place]     TYPE II  . Hyperlipidemia    No past surgical history on file. No Known Allergies Prior to Admission medications   Medication Sig Start Date End Date Taking? Authorizing Provider  blood glucose meter kit and supplies Dispense based on patient and insurance preference. Use up to four times daily as directed. (FOR ICD-10 E10.9, E11.9). 08/23/20  Yes Just, KLaurita Quint FNP  dapagliflozin propanediol (FARXIGA) 10 MG TABS tablet Take 1 tablet (10 mg total) by mouth daily. 06/15/20  Yes SRutherford Guys MD  glimepiride (AMARYL) 4 MG tablet Take 1 tablet (4 mg total) by mouth daily before breakfast. 07/24/20  Yes SGrant FontanaM, MD  glucose blood test strip For appropriate meter, check glucose once a day, Dx E11.65 06/15/20  Yes SRutherford Guys MD  glucose blood test strip Use up to four times daily as needed to check blood sugars daily Please provide patient with covered test strips ( one touch ultra blue was requested) 08/23/20  Yes Just, KLaurita Quint FNP  Lancets (ONETOUCH DELICA PLUS [id number] MISC USE UP TO FOUR TIMES DAILY AS DIRECTED 06/15/20  Yes SRutherford Guys MD  metFORMIN (GLUCOPHAGE) 1000 MG tablet Take 1 tablet (1,000 mg total) by mouth 2 (two) times daily with a meal. 06/15/20  Yes SRutherford Guys MD  rosuvastatin (CRESTOR) 5 MG tablet Take 1 tablet (  5 mg total) by mouth daily. 07/31/20  Yes Rutherford Guys, MD  Omega-3 Fatty Acids (FISH OIL) 1200 MG CPDR Take by mouth. Patient not taking: Reported on 08/31/2020    [provider]   Social History   Socioeconomic History  . Marital status: Married    Spouse name: Not on file  . Number of children: 4  . Years of education: Not on file  . Highest education level: Not on file  Occupational History  . Not on file  Tobacco Use  . Smoking status: Never Smoker  . Smokeless tobacco: Never Used  Vaping Use  . Vaping Use: Never used  Substance and Sexual Activity  . Alcohol use: No  . Drug use: No  . Sexual activity: Yes    Other Topics Concern  . Not on file  Social History Narrative  . Not on file   Social Determinants of Health   Financial Resource Strain:   . Difficulty of Paying Living Expenses: Not on file  Food Insecurity:   . Worried About Charity fundraiser in the Last Year: Not on file  . Ran Out of Food in the Last Year: Not on file  Transportation Needs:   . Lack of Transportation (Medical): Not on file  . Lack of Transportation (Non-Medical): Not on file  Physical Activity:   . Days of Exercise per Week: Not on file  . Minutes of Exercise per Session: Not on file  Stress:   . Feeling of Stress : Not on file  Social Connections:   . Frequency of Communication with Friends and Family: Not on file  . Frequency of Social Gatherings with Friends and Family: Not on file  . Attends Religious Services: Not on file  . Active Member of Clubs or Organizations: Not on file  . Attends Archivist Meetings: Not on file  . Marital Status: Not on file  Intimate Partner Violence:   . Fear of Current or Ex-Partner: Not on file  . Emotionally Abused: Not on file  . Physically Abused: Not on file  . Sexually Abused: Not on file    Review of Systems  Constitutional: Negative for fatigue and unexpected weight change.  Eyes: Negative for visual disturbance.  Respiratory: Negative for cough, chest tightness and shortness of breath.   Cardiovascular: Negative for chest pain, palpitations and leg swelling.  Gastrointestinal: Negative for abdominal pain and blood in stool.  Neurological: Negative for dizziness, light-headedness and headaches.     Objective:   Vitals:   08/31/20 0924 08/31/20 0930  BP: (!) 160/84 130/80  Pulse: 75   Temp: 98.2 F (36.8 C)   TempSrc: Temporal   SpO2: 100%   Weight: 283 lb 12.8 oz (128.7 kg)   Height: '6\' 1"'  (1.854 m)      Physical Exam Vitals reviewed.  Constitutional:      Appearance: He is well-developed.  HENT:     Head: Normocephalic and  atraumatic.  Eyes:     Pupils: Pupils are equal, round, and reactive to light.  Neck:     Vascular: No carotid bruit or JVD.  Cardiovascular:     Rate and Rhythm: Normal rate and regular rhythm.     Heart sounds: Normal heart sounds. No murmur heard.   Pulmonary:     Effort: Pulmonary effort is normal.     Breath sounds: Normal breath sounds. No rales.  Skin:    General: Skin is warm and dry.  Neurological:  Mental Status: He is alert and oriented to person, place, and time.      Assessment & Plan:  Andrew Reeves is a 54 y.o. male . Uncontrolled type 2 diabetes mellitus with hyperglycemia ([place]) - Plan: Fructosamine, Microalbumin / creatinine urine ratio  -Tolerating glimepiride, improved home readings, check fructosamine then 43-monthoffice visit for A1c.  No changes for now.  Hyperlipidemia, unspecified hyperlipidemia type - Plan: Comprehensive metabolic panel, Lipid panel  -Tolerating Crestor, check lipids, no med changes at this time  Need for prophylactic vaccination against Streptococcus pneumoniae (pneumococcus) - Plan: Pneumococcal polysaccharide vaccine 23-valent greater than or equal to 2yo subcutaneous/IM   No orders of the defined types were placed in this encounter.  Patient Instructions     Continue same medications for now, recheck in 2 months.  I will let you know if any changes needed based on your bloodwork.  Thanks for coming in today.  If you have lab work done today you will be contacted with your lab results within the next 2 weeks.  If you have not heard from [place] please contact [place] The fastest way to get your results is to register for My Chart.   IF you received an x-ray today, you will receive an invoice from [facility]. Please contact [facility] at [phone] questions or concerns regarding your invoice.   IF you received labwork today, you will receive an invoice from [place] Please contact [facility] at  [phone] questions or concerns regarding your invoice.   Our billing staff will not be able to assist you with questions regarding bills from these companies.  You will be contacted with the lab results as soon as they are available. The fastest way to get your results is to activate your My Chart account. Instructions are located on the last page of this paperwork. If you have not heard from [place] the results in 2 weeks, please contact this office.          Signed, JMerri Ray MD [facility]

## 2020-08-31 NOTE — Addendum Note (Signed)
Addended by: Kittie Plater, Dael Howland HUA on: 08/31/2020 04:48 PM   Modules accepted: Orders

## 2020-08-31 NOTE — Telephone Encounter (Signed)
Pt just left office and wanted to have an A1c and forgot to tell Dr.Greene for his procedure next week

## 2020-08-31 NOTE — Telephone Encounter (Signed)
POCT A1C has come back and I have also documented the Flu and Covid Vaccine.

## 2020-09-01 LAB — COMPREHENSIVE METABOLIC PANEL
ALT: 29 IU/L (ref 0–44)
AST: 20 IU/L (ref 0–40)
Albumin/Globulin Ratio: 1.8 (ref 1.2–2.2)
Albumin: 4.4 g/dL (ref 3.8–4.9)
Alkaline Phosphatase: 64 IU/L (ref 44–121)
BUN/Creatinine Ratio: 9 (ref 9–20)
BUN: 10 mg/dL (ref 6–24)
Bilirubin Total: 0.5 mg/dL (ref 0.0–1.2)
CO2: 22 mmol/L (ref 20–29)
Calcium: 9.4 mg/dL (ref 8.7–10.2)
Chloride: 103 mmol/L (ref 96–106)
Creatinine, Ser: 1.1 mg/dL (ref 0.76–1.27)
GFR calc Af Amer: 86 mL/min/{1.73_m2} (ref >59)
GFR calc non Af Amer: 75 mL/min/{1.73_m2} (ref >59)
Globulin, Total: 2.5 g/dL (ref 1.5–4.5)
Glucose: 137 mg/dL — ABNORMAL HIGH (ref 65–99)
Potassium: 4.4 mmol/L (ref 3.5–5.2)
Sodium: 138 mmol/L (ref 134–144)
Total Protein: 6.9 g/dL (ref 6.0–8.5)

## 2020-09-01 LAB — MICROALBUMIN / CREATININE URINE RATIO
Creatinine, Urine: 269.9 mg/dL
Microalb/Creat Ratio: 17 mg/g creat (ref 0–29)
Microalbumin, Urine: 44.9 ug/mL

## 2020-09-01 LAB — LIPID PANEL
Chol/HDL Ratio: 4.2 ratio (ref 0.0–5.0)
Cholesterol, Total: 150 mg/dL (ref 100–199)
HDL: 36 mg/dL — ABNORMAL LOW (ref >39)
LDL Chol Calc (NIH): 61 mg/dL (ref 0–99)
Triglycerides: 341 mg/dL — ABNORMAL HIGH (ref 0–149)
VLDL Cholesterol Cal: 53 mg/dL — ABNORMAL HIGH (ref 5–40)

## 2020-09-01 LAB — FRUCTOSAMINE: Fructosamine: 316 umol/L — ABNORMAL HIGH (ref 0–285)

## 2020-09-03 ENCOUNTER — Other Ambulatory Visit (HOSPITAL_COMMUNITY)
Admission: RE | Admit: 2020-09-03 | Discharge: 2020-09-03 | Disposition: A | Discharge status: Home / Self Care | Payer: 59 | Source: Ambulatory Visit | Attending: General Surgery | Admitting: General Surgery

## 2020-09-03 DIAGNOSIS — Z01812 Encounter for preprocedural laboratory examination: Secondary | ICD-10-CM | POA: Diagnosis not present

## 2020-09-03 DIAGNOSIS — Z20822 Contact with and (suspected) exposure to covid-19: Secondary | ICD-10-CM | POA: Diagnosis not present

## 2020-09-03 LAB — SARS CORONAVIRUS 2 (TAT 6-24 HRS): SARS Coronavirus 2: NEGATIVE

## 2020-09-05 ENCOUNTER — Other Ambulatory Visit: Payer: Self-pay

## 2020-09-05 ENCOUNTER — Encounter (HOSPITAL_COMMUNITY): Payer: Self-pay | Admitting: General Surgery

## 2020-09-05 ENCOUNTER — Ambulatory Visit: Payer: Self-pay | Admitting: General Surgery

## 2020-09-05 NOTE — Progress Notes (Signed)
Patient denies shortness of breath, fever, cough or chest pain.  PCP - Dr Merri Ray Cardiologist - n/a  Chest x-ray - n/a EKG - n/a Stress Test - n/a ECHO - n/a Cardiac Cath - n/a  STOP now taking any Aspirin (unless otherwise instructed by your surgeon), Aleve, Naproxen, Ibuprofen, Motrin, Advil, Goody's, BC's, all herbal medications, fish oil, and all vitamins.   No DM meds on DOS.  Coronavirus Screening Covid test on 09/03/20 was negative.  Patient verbalized understanding of instructions that were given via phone.

## 2020-09-06 ENCOUNTER — Ambulatory Visit (HOSPITAL_COMMUNITY)
Admission: RE | Admit: 2020-09-06 | Discharge: 2020-09-06 | Disposition: A | Discharge status: Home / Self Care | Payer: 59 | Source: Ambulatory Visit | Attending: General Surgery | Admitting: General Surgery

## 2020-09-06 ENCOUNTER — Encounter (HOSPITAL_COMMUNITY)
Admission: RE | Disposition: A | Discharge status: Home / Self Care | Payer: Self-pay | Source: Ambulatory Visit | Attending: General Surgery

## 2020-09-06 ENCOUNTER — Encounter (HOSPITAL_COMMUNITY): Payer: Self-pay | Admitting: General Surgery

## 2020-09-06 ENCOUNTER — Ambulatory Visit (HOSPITAL_COMMUNITY): Payer: 59 | Admitting: Certified Registered"

## 2020-09-06 DIAGNOSIS — Z7984 Long term (current) use of oral hypoglycemic drugs: Secondary | ICD-10-CM | POA: Diagnosis not present

## 2020-09-06 DIAGNOSIS — D171 Benign lipomatous neoplasm of skin and subcutaneous tissue of trunk: Secondary | ICD-10-CM | POA: Insufficient documentation

## 2020-09-06 HISTORY — PX: LIPOMA EXCISION: SHX5283

## 2020-09-06 LAB — GLUCOSE, CAPILLARY
Glucose-Capillary: 160 mg/dL — ABNORMAL HIGH (ref 70–99)
Glucose-Capillary: 166 mg/dL — ABNORMAL HIGH (ref 70–99)

## 2020-09-06 SURGERY — EXCISION LIPOMA
Anesthesia: General | Laterality: Left

## 2020-09-06 MED ORDER — BUPIVACAINE HCL (PF) 0.25 % IJ SOLN
INTRAMUSCULAR | Status: AC
  Filled 2020-09-06: qty 30

## 2020-09-06 MED ORDER — PROPOFOL 1000 MG/100ML IV EMUL
INTRAVENOUS | Status: AC
  Filled 2020-09-06: qty 100

## 2020-09-06 MED ORDER — PHENYLEPHRINE HCL (PRESSORS) 10 MG/ML IV SOLN
INTRAVENOUS | Status: DC | PRN
  Administered 2020-09-06 (×2): 120 ug via INTRAVENOUS
  Administered 2020-09-06 (×3): 80 ug via INTRAVENOUS

## 2020-09-06 MED ORDER — PHENYLEPHRINE HCL-NACL 10-0.9 MG/250ML-% IV SOLN
INTRAVENOUS | Status: DC | PRN
  Administered 2020-09-06: 50 ug/min via INTRAVENOUS

## 2020-09-06 MED ORDER — CHLORHEXIDINE GLUCONATE 0.12 % MT SOLN: 15.0000 mL | Freq: Once | OROMUCOSAL | Status: AC

## 2020-09-06 MED ORDER — FENTANYL CITRATE (PF) 100 MCG/2ML IJ SOLN
INTRAMUSCULAR | Status: DC | PRN
  Administered 2020-09-06 (×2): 25 ug via INTRAVENOUS
  Administered 2020-09-06: 50 ug via INTRAVENOUS

## 2020-09-06 MED ORDER — PROPOFOL 10 MG/ML IV BOLUS
INTRAVENOUS | Status: AC
  Filled 2020-09-06: qty 20

## 2020-09-06 MED ORDER — MIDAZOLAM HCL 2 MG/2ML IJ SOLN
INTRAMUSCULAR | Status: AC
  Filled 2020-09-06: qty 2

## 2020-09-06 MED ORDER — FENTANYL CITRATE (PF) 250 MCG/5ML IJ SOLN
INTRAMUSCULAR | Status: AC
  Filled 2020-09-06: qty 5

## 2020-09-06 MED ORDER — PROPOFOL 10 MG/ML IV BOLUS
INTRAVENOUS | Status: DC | PRN
  Administered 2020-09-06: 50 mg via INTRAVENOUS
  Administered 2020-09-06: 200 mg via INTRAVENOUS

## 2020-09-06 MED ORDER — LACTATED RINGERS IV SOLN: INTRAVENOUS | Status: DC

## 2020-09-06 MED ORDER — LIDOCAINE 2% (20 MG/ML) 5 ML SYRINGE
INTRAMUSCULAR | Status: DC | PRN
  Administered 2020-09-06: 20 mg via INTRAVENOUS

## 2020-09-06 MED ORDER — ACETAMINOPHEN 500 MG PO TABS
1000.0000 mg | ORAL_TABLET | ORAL | Status: AC
  Administered 2020-09-06: 1000 mg via ORAL
  Filled 2020-09-06: qty 2

## 2020-09-06 MED ORDER — KETOROLAC TROMETHAMINE 30 MG/ML IJ SOLN: 30.0000 mg | Freq: Once | INTRAMUSCULAR | Status: DC | PRN

## 2020-09-06 MED ORDER — EPHEDRINE SULFATE 50 MG/ML IJ SOLN
INTRAMUSCULAR | Status: DC | PRN
  Administered 2020-09-06 (×2): 5 mg via INTRAVENOUS

## 2020-09-06 MED ORDER — OXYCODONE HCL 5 MG/5ML PO SOLN: 5.0000 mg | Freq: Once | ORAL | Status: DC | PRN

## 2020-09-06 MED ORDER — ENSURE PRE-SURGERY PO LIQD: 296.0000 mL | Freq: Once | ORAL | Status: DC

## 2020-09-06 MED ORDER — CHLORHEXIDINE GLUCONATE 0.12 % MT SOLN
OROMUCOSAL | Status: AC
  Administered 2020-09-06: 15 mL via OROMUCOSAL
  Filled 2020-09-06: qty 15

## 2020-09-06 MED ORDER — OXYCODONE HCL 5 MG PO TABS: 5.0000 mg | ORAL_TABLET | Freq: Once | ORAL | Status: DC | PRN

## 2020-09-06 MED ORDER — ONDANSETRON HCL 4 MG/2ML IJ SOLN
INTRAMUSCULAR | Status: DC | PRN
  Administered 2020-09-06: 4 mg via INTRAVENOUS

## 2020-09-06 MED ORDER — ORAL CARE MOUTH RINSE: 15.0000 mL | Freq: Once | OROMUCOSAL | Status: AC

## 2020-09-06 MED ORDER — FENTANYL CITRATE (PF) 100 MCG/2ML IJ SOLN: 25.0000 ug | INTRAMUSCULAR | Status: DC | PRN

## 2020-09-06 MED ORDER — BUPIVACAINE-EPINEPHRINE 0.25% -1:200000 IJ SOLN
INTRAMUSCULAR | Status: DC | PRN
  Administered 2020-09-06: 10 mL

## 2020-09-06 MED ORDER — PROMETHAZINE HCL 25 MG/ML IJ SOLN: 6.2500 mg | INTRAMUSCULAR | Status: DC | PRN

## 2020-09-06 MED ORDER — MIDAZOLAM HCL 5 MG/5ML IJ SOLN
INTRAMUSCULAR | Status: DC | PRN
  Administered 2020-09-06: 2 mg via INTRAVENOUS

## 2020-09-06 MED ORDER — GABAPENTIN 300 MG PO CAPS
300.0000 mg | ORAL_CAPSULE | ORAL | Status: AC
  Administered 2020-09-06: 300 mg via ORAL
  Filled 2020-09-06: qty 1

## 2020-09-06 MED ORDER — CHLORHEXIDINE GLUCONATE CLOTH 2 % EX PADS: 6.0000 | MEDICATED_PAD | Freq: Once | CUTANEOUS | Status: DC

## 2020-09-06 MED ORDER — CEFAZOLIN SODIUM-DEXTROSE 2-4 GM/100ML-% IV SOLN
2.0000 g | INTRAVENOUS | Status: AC
  Administered 2020-09-06: 2 g via INTRAVENOUS
  Filled 2020-09-06: qty 100

## 2020-09-06 MED ORDER — TRAMADOL HCL 50 MG PO TABS
50.0000 mg | ORAL_TABLET | Freq: Four times a day (QID) | ORAL | 0 refills | Status: DC | PRN
Start: 2020-09-06 — End: 2021-03-01

## 2020-09-06 SURGICAL SUPPLY — 36 items
ADH SKN CLS APL DERMABOND .7 (GAUZE/BANDAGES/DRESSINGS) ×2
APL PRP STRL LF DISP 70% ISPRP (MISCELLANEOUS) ×1
BLADE CLIPPER SURG (BLADE) IMPLANT
CHLORAPREP W/TINT 26 (MISCELLANEOUS) ×2 IMPLANT
COVER SURGICAL LIGHT HANDLE (MISCELLANEOUS) ×2 IMPLANT
COVER WAND RF STERILE (DRAPES) ×2 IMPLANT
DERMABOND ADVANCED (GAUZE/BANDAGES/DRESSINGS) ×2
DERMABOND ADVANCED .7 DNX12 (GAUZE/BANDAGES/DRESSINGS) IMPLANT
DRAPE LAPAROTOMY 100X72 PEDS (DRAPES) IMPLANT
DRAPE ORTHO SPLIT 77X108 STRL (DRAPES)
DRAPE SURG ORHT 6 SPLT 77X108 (DRAPES) IMPLANT
ELECT REM PT RETURN 9FT ADLT (ELECTROSURGICAL) ×2
ELECTRODE REM PT RTRN 9FT ADLT (ELECTROSURGICAL) ×1 IMPLANT
GAUZE SPONGE 4X4 12PLY STRL (GAUZE/BANDAGES/DRESSINGS) IMPLANT
GLOVE BIO SURGEON STRL SZ7.5 (GLOVE) ×2 IMPLANT
GLOVE BIOGEL PI IND STRL 8 (GLOVE) ×1 IMPLANT
GLOVE BIOGEL PI INDICATOR 8 (GLOVE) ×1
GOWN STRL REUS W/ TWL LRG LVL3 (GOWN DISPOSABLE) ×1 IMPLANT
GOWN STRL REUS W/ TWL XL LVL3 (GOWN DISPOSABLE) ×1 IMPLANT
GOWN STRL REUS W/TWL LRG LVL3 (GOWN DISPOSABLE) ×2
GOWN STRL REUS W/TWL XL LVL3 (GOWN DISPOSABLE) ×2
KIT BASIN OR (CUSTOM PROCEDURE TRAY) ×2 IMPLANT
KIT TURNOVER KIT B (KITS) ×2 IMPLANT
NDL HYPO 25GX1X1/2 BEV (NEEDLE) ×1 IMPLANT
NEEDLE HYPO 25GX1X1/2 BEV (NEEDLE) ×2 IMPLANT
NS IRRIG 1000ML POUR BTL (IV SOLUTION) ×2 IMPLANT
PACK GENERAL/GYN (CUSTOM PROCEDURE TRAY) ×2 IMPLANT
PAD ARMBOARD 7.5X6 YLW CONV (MISCELLANEOUS) ×2 IMPLANT
PENCIL SMOKE EVACUATOR (MISCELLANEOUS) ×2 IMPLANT
SPECIMEN JAR SMALL (MISCELLANEOUS) ×2 IMPLANT
SUT MNCRL AB 4-0 PS2 18 (SUTURE) ×2 IMPLANT
SUT VIC AB 3-0 SH 18 (SUTURE) ×2 IMPLANT
SYR CONTROL 10ML LL (SYRINGE) ×2 IMPLANT
TOWEL GREEN STERILE (TOWEL DISPOSABLE) ×2 IMPLANT
TOWEL GREEN STERILE FF (TOWEL DISPOSABLE) ×2 IMPLANT
UNDERPAD 30X36 HEAVY ABSORB (UNDERPADS AND DIAPERS) IMPLANT

## 2020-09-06 NOTE — Anesthesia Postprocedure Evaluation (Signed)
Anesthesia Post Note  Patient: Andrew Reeves  Procedure(s) Performed: EXCISION OF BACK LIPOMA (Left )     Patient location during evaluation: PACU Anesthesia Type: General Level of consciousness: awake and alert Pain management: pain level controlled Vital Signs Assessment: post-procedure vital signs reviewed and stable Respiratory status: spontaneous breathing, nonlabored ventilation, respiratory function stable and patient connected to nasal cannula oxygen Cardiovascular status: blood pressure returned to baseline and stable Postop Assessment: no apparent nausea or vomiting Anesthetic complications: no   No complications documented.  Last Vitals:  Vitals:   09/06/20 1440 09/06/20 1455  BP: 133/86 131/85  Pulse: 60 (!) 59  Resp: 18 12  Temp:    SpO2: 99% 100%    Last Pain:  Vitals:   09/06/20 1455  TempSrc:   PainSc: Asleep                 [name]

## 2020-09-06 NOTE — Discharge Instructions (Signed)
Lipoma Removal, Care After This sheet gives you information about how to care for yourself after your procedure. Your health care provider may also give you more specific instructions. If you have problems or questions, contact your health care provider. What can I expect after the procedure? After the procedure, it is common to have:  Mild pain.  Swelling.  Bruising. Follow these instructions at home: Bathing   Do not take baths, swim, or use a hot tub until your health care provider approves. Ask your health care provider if you may take showers. You may only be allowed to take sponge baths.  Keep your bandage (dressing) dry until your health care provider says it can be removed. Incision care   Follow instructions from your health care provider about how to take care of your incision. Make sure you: ? Wash your hands with soap and water for at least 20 seconds before and after you change your dressing. If soap and water are not available, use hand sanitizer. ? Change your dressing as told by your health care provider. ? Leave stitches (sutures), skin glue, or adhesive strips in place. These skin closures may need to stay in place for 2 weeks or longer. If adhesive strip edges start to loosen and curl up, you may trim the loose edges. Do not remove adhesive strips completely unless your health care provider tells you to do that.  Check your incision area every day for signs of infection. Check for: ? More redness, swelling, or pain. ? Fluid or blood. ? Warmth. ? Pus or a bad smell. Medicines  Take over-the-counter and prescription medicines only as told by your health care provider.  If you were prescribed an antibiotic medicine, use it as told by your health care provider. Do not stop using the antibiotic even if you start to feel better. General instructions   If you were given a sedative during the procedure, it can affect you for several hours. Do not drive or operate  machinery until your health care provider says that it is safe.  Do not use any products that contain nicotine or tobacco, such as cigarettes, e-cigarettes, and chewing tobacco. These can delay healing. If you need help quitting, ask your health care provider.  Return to your normal activities as told by your health care provider. Ask your health care provider what activities are safe for you.  Keep all follow-up visits as told by your health care provider. This is important. Contact a health care provider if:  You have more redness, swelling, or pain around your incision.  You have fluid or blood coming from your incision.  Your incision feels warm to the touch.  You have pus or a bad smell coming from your incision.  You have pain that does not get better with medicine. Get help right away if:  You have chills or a fever.  You have severe pain. Summary  After the procedure, it is common to have mild pain, swelling, and bruising.  Follow instructions from your health care provider about how to take care of your incision.  Check your incision area every day for signs of infection.  Contact a health care provider if you have more redness, swelling, or pain around your incision. This information is not intended to replace advice given to you by your health care provider. Make sure you discuss any questions you have with your health care provider. Document Revised: 05/30/2019 Document Reviewed: 05/30/2019 Elsevier Patient Education  2020 Elsevier  Inc. ° °

## 2020-09-06 NOTE — Transfer of Care (Signed)
Immediate Anesthesia Transfer of Care Note  Patient: Andrew Reeves  Procedure(s) Performed: EXCISION OF BACK LIPOMA (Left )  Patient Location: PACU  Anesthesia Type:General  Level of Consciousness: awake, alert  and oriented  Airway & Oxygen Therapy: Patient Spontanous Breathing  Post-op Assessment: Report given to RN, Post -op Vital signs reviewed and stable and Patient moving all extremities  Post vital signs: Reviewed and stable  Last Vitals:  Vitals Value Taken Time  BP    Temp    Pulse    Resp    SpO2      Last Pain:  Vitals:   09/06/20 1114  TempSrc:   PainSc: 0-No pain         Complications: No complications documented.

## 2020-09-06 NOTE — Anesthesia Preprocedure Evaluation (Addendum)
Anesthesia Evaluation  Patient identified by MRN, date of birth, ID band Patient awake    Reviewed: Allergy & Precautions, NPO status , Patient's Chart, lab work & pertinent test results  Airway Mallampati: III  TM Distance: >3 FB Neck ROM: Full    Dental no notable dental hx.    Pulmonary neg pulmonary ROS,    Pulmonary exam normal breath sounds clear to auscultation       Cardiovascular negative cardio ROS Normal cardiovascular exam Rhythm:Regular Rate:Normal  ECG: rate 90. Normal sinus rhythm Right axis deviation   Neuro/Psych negative neurological ROS  negative psych ROS   GI/Hepatic negative GI ROS, Neg liver ROS,   Endo/Other  diabetes, Oral Hypoglycemic Agents  Renal/GU negative Renal ROS     Musculoskeletal negative musculoskeletal ROS (+)   Abdominal (+) + obese,   Peds  Hematology HLD   Anesthesia Other Findings back lipoma  Reproductive/Obstetrics                            Anesthesia Physical Anesthesia Plan  ASA: II  Anesthesia Plan: General   Post-op Pain Management:    Induction: Intravenous  PONV Risk Score and Plan: 2 and Ondansetron, Dexamethasone, Midazolam and Treatment may vary due to age or medical condition  Airway Management Planned: Oral ETT  Additional Equipment:   Intra-op Plan:   Post-operative Plan: Extubation in OR  Informed Consent: I have reviewed the patients History and Physical, chart, labs and discussed the procedure including the risks, benefits and alternatives for the proposed anesthesia with the patient or authorized representative who has indicated his/her understanding and acceptance.     Dental advisory given  Plan Discussed with: CRNA  Anesthesia Plan Comments:         Anesthesia Quick Evaluation

## 2020-09-06 NOTE — Anesthesia Procedure Notes (Signed)
Procedure Name: LMA Insertion Date/Time: 09/06/2020 1:16 PM Performed by: Amadeo Garnet, CRNA Pre-anesthesia Checklist: Patient identified, Emergency Drugs available, Suction available and Patient being monitored Patient Re-evaluated:Patient Re-evaluated prior to induction Oxygen Delivery Method: Circle system utilized Preoxygenation: Pre-oxygenation with 100% oxygen Induction Type: IV induction LMA: LMA inserted LMA Size: 5.0 Placement Confirmation: positive ETCO2 and breath sounds checked- equal and bilateral Tube secured with: Tape Dental Injury: Teeth and Oropharynx as per pre-operative assessment

## 2020-09-06 NOTE — Op Note (Signed)
09/06/2020  1:44 PM  PATIENT:  Andrew Reeves  54 y.o. male  PRE-OPERATIVE DIAGNOSIS:  back lipoma  POST-OPERATIVE DIAGNOSIS:  back lipoma  PROCEDURE:  Procedure(s): EXCISION OF BACK LIPOMA 9x6cm (Left)  SURGEON:  Surgeon(s) and Role:    Ralene Ok, MD - Primary  ANESTHESIA:   local and general  EBL:  minimal   BLOOD ADMINISTERED:none  DRAINS: none   LOCAL MEDICATIONS USED:  BUPIVICAINE   SPECIMEN:  Source of Specimen:  Back mass  DISPOSITION OF SPECIMEN:  PATHOLOGY  COUNTS:  YES  TOURNIQUET:  * No tourniquets in log *  DICTATION: .Dragon Dictation After the patient was consented she was taken to OR and placed in supine position with bilateral SCDs in place. He underwent GETA. He was in place in the lateral position. Patient was prepped and draped in standard fashion. Timeout was called all facts verified.  Quarter percent Marcaine with epinephrine was then used to create a field block around the cyst.  A 5cm  incision was made over the area of the mass. This was dissected down sharply and circumferentially.  The mass was circumfrentially dissected away from the surrounding tissue. This was excised. This was sent to pathology. Cautery was used to maintain hemostasis. At this time 2-0 Vicryl used to reapproximate the deep dermal layers. 4-0 Monocryl was used to reapproximate the skin in a subcuticular fashion. The skin was dressed with Dermabond. The patient tied the procedure well was taken to recovery room stable condition.   PLAN OF CARE: Discharge to home after PACU  PATIENT DISPOSITION:  PACU - hemodynamically stable.   Delay start of Pharmacological VTE agent (>24hrs) due to surgical blood loss or risk of bleeding: not applicable

## 2020-09-06 NOTE — H&P (Signed)
  History of Present Illness The patient is a 54 year old male who presents with a complaint of lipoma. Patient is a 54 year old male who is referred by Dr. Pamella Pert for evaluation of a back lipoma Chief complaint back lipoma Patient states this been there for extended years. He states his gotten larger and is becoming more painful. He states there is been no drainage from the area in is no erythema or signs of infection to the area in the past.  Patient has a history of diabetes.   Allergies No Known Drug Allergies  [02/11/2018]: Allergies Reconciled   Medication History Farxiga (5MG  Tablet, Oral) Active. MetFORMIN HCl (1000MG  Tablet, Oral) Active. Medications Reconciled    Review of Systems  General Not Present- Appetite Loss, Chills, Fatigue, Fever, Night Sweats, Weight Gain and Weight Loss. Skin Present- Change in Wart/Mole. Not Present- Dryness, Hives, Jaundice, New Lesions, Non-Healing Wounds, Rash and Ulcer. HEENT Not Present- Earache, Hearing Loss, Hoarseness, Nose Bleed, Oral Ulcers, Ringing in the Ears, Seasonal Allergies, Sinus Pain, Sore Throat, Visual Disturbances, Wears glasses/contact lenses and Yellow Eyes. Respiratory Not Present- Bloody sputum, Chronic Cough, Difficulty Breathing, Snoring and Wheezing. Breast Not Present- Breast Mass, Breast Pain, Nipple Discharge and Skin Changes. Cardiovascular Not Present- Chest Pain, Difficulty Breathing Lying Down, Leg Cramps, Palpitations, Rapid Heart Rate, Shortness of Breath and Swelling of Extremities. Gastrointestinal Not Present- Abdominal Pain, Bloating, Bloody Stool, Change in Bowel Habits, Chronic diarrhea, Constipation, Difficulty Swallowing, Excessive gas, Gets full quickly at meals, Hemorrhoids, Indigestion, Nausea, Rectal Pain and Vomiting. Male Genitourinary Not Present- Blood in Urine, Change in Urinary Stream, Frequency, Impotence, Nocturia, Painful Urination, Urgency and Urine Leakage. Musculoskeletal Not  Present- Back Pain, Joint Pain, Joint Stiffness, Muscle Pain, Muscle Weakness and Swelling of Extremities. Neurological Not Present- Decreased Memory, Fainting, Headaches, Numbness, Seizures, Tingling, Tremor, Trouble walking and Weakness. Psychiatric Not Present- Anxiety, Bipolar, Change in Sleep Pattern, Depression, Fearful and Frequent crying. Endocrine Not Present- Cold Intolerance, Excessive Hunger, Hair Changes, Heat Intolerance and New Diabetes. Hematology Not Present- Blood Thinners, Easy Bruising, Excessive bleeding, Gland problems, HIV and Persistent Infections. All other systems negative  Ht 6\' 2"  (1.88 m)   Wt 122.5 kg   BMI 34.67 kg/m       Physical Exam  The physical exam findings are as follows: Note: Constitutional: No acute distress, conversant, appears stated age  Eyes: Anicteric sclerae, moist conjunctiva, no lid lag  Neck: No thyromegaly, trachea midline, no cervical lymphadenopathy  Lungs: Clear to auscultation biilaterally, normal respiratory effot  Cardiovascular: regular rate & rhythm, no murmurs, no peripheal edema, pedal pulses 2+  GI: Soft, no masses or hepatosplenomegaly, non-tender to palpation  MSK: Normal gait, no clubbing cyanosis, edema  Skin: No rashes, palpation reveals normal skin turgor, approximately 8 x 5 cm left paraspinal back mass, spongy, multiple  Psychiatric: Appropriate judgment and insight, oriented to person, place, and time    Assessment & Plan LIPOMA OF BACK (D17.1) Impression: patient is a 54 year old male with a left paraspinal back lipoma. 1. He would like to proceed with operative excision of back Mass. 2. I discussed with him the risks benefits the procedure to include but not limited to: Infection, bleeding, damage to structures, possible recurrence. Patient was understanding and wished to proceed.

## 2020-09-07 ENCOUNTER — Encounter (HOSPITAL_COMMUNITY): Payer: Self-pay | Admitting: General Surgery

## 2020-09-07 LAB — SURGICAL PATHOLOGY

## 2020-09-07 NOTE — Addendum Note (Signed)
Addendum  created 09/07/20 1031 by Amadeo Garnet, CRNA   Intraprocedure Event edited

## 2020-10-08 ENCOUNTER — Other Ambulatory Visit: Payer: 59

## 2020-10-08 DIAGNOSIS — Z20822 Contact with and (suspected) exposure to covid-19: Secondary | ICD-10-CM

## 2020-10-09 LAB — SARS-COV-2, NAA 2 DAY TAT

## 2020-10-09 LAB — NOVEL CORONAVIRUS, NAA: SARS-CoV-2, NAA: NOT DETECTED

## 2020-10-31 ENCOUNTER — Ambulatory Visit: Payer: 59 | Admitting: Family Medicine

## 2020-11-22 ENCOUNTER — Telehealth: Payer: Self-pay | Admitting: Family Medicine

## 2020-11-22 DIAGNOSIS — E1165 Type 2 diabetes mellitus with hyperglycemia: Secondary | ICD-10-CM

## 2020-11-22 NOTE — Telephone Encounter (Signed)
I have pended order for labs nurse visit can be set for whenever

## 2020-11-22 NOTE — Telephone Encounter (Signed)
Called pt and sch lab appt

## 2020-11-22 NOTE — Telephone Encounter (Signed)
11/22/2020 - PATIENT HAD TO RESCHEDULE HIS 2 MONTH FOLLOW-UP TO RECHECK HIS DIABETES WITH DR. Carlota Raspberry. HIS NEW APPOINTMENT IS ON Thursday 12/06/2020 AT 3:00pm. HE WOULD LIKE TO COME IN A FEW DAYS BEFORE TO GET HIS BLOOD DRAWN SO HE DOES NOT HAVE TO GO ALL DAY WITHOUT EATING. PLEASE ASK DR. GREENE TO PUT A LAB ORDER IN. THEN CALL MR. Pralle SO WE CAN SCHEDULE A NURSE'S VISIT.  BEST PHONE IS: [phone] (CELL)  MBC

## 2020-11-23 ENCOUNTER — Ambulatory Visit: Payer: 59 | Admitting: Family Medicine

## 2020-12-03 ENCOUNTER — Ambulatory Visit (INDEPENDENT_AMBULATORY_CARE_PROVIDER_SITE_OTHER): Payer: 59 | Admitting: Family Medicine

## 2020-12-03 ENCOUNTER — Other Ambulatory Visit: Payer: Self-pay

## 2020-12-03 DIAGNOSIS — E1165 Type 2 diabetes mellitus with hyperglycemia: Secondary | ICD-10-CM

## 2020-12-03 LAB — HEMOGLOBIN A1C
Est. average glucose Bld gHb Est-mCnc: 237 mg/dL
Hgb A1c MFr Bld: 9.9 % — ABNORMAL HIGH (ref 4.8–5.6)

## 2020-12-04 LAB — MICROALBUMIN / CREATININE URINE RATIO
Creatinine, Urine: 136.8 mg/dL
Microalb/Creat Ratio: 24 mg/g creat (ref 0–29)
Microalbumin, Urine: 32.8 ug/mL

## 2020-12-06 ENCOUNTER — Other Ambulatory Visit: Payer: Self-pay

## 2020-12-06 ENCOUNTER — Encounter: Payer: Self-pay | Admitting: Family Medicine

## 2020-12-06 ENCOUNTER — Ambulatory Visit (INDEPENDENT_AMBULATORY_CARE_PROVIDER_SITE_OTHER): Payer: 59 | Admitting: Family Medicine

## 2020-12-06 VITALS — BP 140/82 | HR 85 | Temp 98.0°F | Resp 16 | Ht 74.0 in | Wt 286.4 lb

## 2020-12-06 DIAGNOSIS — E1165 Type 2 diabetes mellitus with hyperglycemia: Secondary | ICD-10-CM

## 2020-12-06 DIAGNOSIS — E785 Hyperlipidemia, unspecified: Secondary | ICD-10-CM | POA: Diagnosis not present

## 2020-12-06 MED ORDER — ROSUVASTATIN CALCIUM 5 MG PO TABS: 5.0000 mg | ORAL_TABLET | Freq: Every day | ORAL | 1 refills | Status: DC

## 2020-12-06 MED ORDER — METFORMIN HCL 1000 MG PO TABS: 1000.0000 mg | ORAL_TABLET | Freq: Two times a day (BID) | ORAL | 1 refills | Status: DC

## 2020-12-06 MED ORDER — DAPAGLIFLOZIN PROPANEDIOL 10 MG PO TABS: 10.0000 mg | ORAL_TABLET | Freq: Every day | ORAL | 1 refills | Status: DC

## 2020-12-06 MED ORDER — GLIMEPIRIDE 2 MG PO TABS: 2.0000 mg | ORAL_TABLET | Freq: Every day | ORAL | 1 refills | Status: DC

## 2020-12-06 MED ORDER — GLIMEPIRIDE 4 MG PO TABS: 4.0000 mg | ORAL_TABLET | Freq: Every day | ORAL | 1 refills | Status: DC

## 2020-12-06 NOTE — Patient Instructions (Addendum)
Based on your current A1c I would recommend once per week or insulin for improved control. If you want to try cahnge in diet, stopping sodas may have some impact. Keep up the good work avoiding those. I will refer you to nutritionist to work on other diet changes for improved diabetes control. Use pill minder to help rem Increase glimepiride to total 6mg  per day. Continue metformin and farxiga same doses for now.  Recheck A1c in 3 months. Sooner if readings are higher.   If you have lab work done today you will be contacted with your lab results within the next 2 weeks.  If you have not heard from [place] then please contact us. The fastest way to get your results is to register for My Chart.   IF you received an x-ray today, you will receive an invoice from [facility]. Please contact [facility] at [phone] with questions or concerns regarding your invoice.   IF you received labwork today, you will receive an invoice from [facility]. Please contact [facility] at [phone] with questions or concerns regarding your invoice.   Our billing staff will not be able to assist you with questions regarding bills from these companies.  You will be contacted with the lab results as soon as they are available. The fastest way to get your results is to activate your My Chart account. Instructions are located on the last page of this paperwork. If you have not heard from [place] regarding the results in 2 weeks, please contact this office.

## 2020-12-06 NOTE — Progress Notes (Signed)
Subjective:  Patient ID: Andrew Reeves, male    DOB: [birth year]  Age: 55 y.o. MRN: [id number]  CC:  Chief Complaint  Patient presents with  . Diabetes    Pt here today to follow up on diabetes noted his A1c had gone up and has made an effort to back off soda and try to lose weight to change this     HPI Andrew Reeves presents for   Diabetes: Uncontrolled, complicated by hyperglycemia.  Transitional care visit with me in November.  A1c 8.7 at that time.  Trulicity was cost prohibitive.  Glimepiride 4 mg daily has been added previously. Continued Metformin 1000 mg twice daily, Farxiga 10 mg daily. He is on statin. Trying to adjust diet since he saw elevated A1c few days ago. Had been drinking soda - 2 liter of soda a day. No missed doses 1-2 per week. Feels like he would like to work on weight and diet at this time.  Fasting: 140-150 No postprandials. No lows. Lowest 110 - months ago. Past month 125 as low.  Does not want to start anything new at this time.   Microalbumin: normal ratio 3 days ago.  Optho, foot exam, pneumovax: up to date.   Lab Results  Component Value Date   HGBA1C 9.9 (H) 12/03/2020   HGBA1C 8.7 (A) 08/31/2020   HGBA1C 9.6 (H) 07/20/2020   Lab Results  Component Value Date   LDLCALC 61 08/31/2020   CREATININE 1.10 08/31/2020     History Patient Active Problem List   Diagnosis Date Noted  . Hypertriglyceridemia 01/01/2018  . Diabetes ([place]) 03/29/2017   Past Medical History:  Diagnosis Date  . Diabetes mellitus without complication ([place])    TYPE 2  . Hyperlipidemia    Past Surgical History:  Procedure Laterality Date  . HERNIA REPAIR    . LIPOMA EXCISION Left 09/06/2020   Procedure: EXCISION OF BACK LIPOMA;  Surgeon: Ralene Ok, MD;  Location: [facility];  Service: General;  Laterality: Left;   No Known Allergies Prior to Admission medications   Medication Sig Start Date End Date Taking? Authorizing Provider  blood glucose meter kit and  supplies Dispense based on patient and insurance preference. Use up to four times daily as directed. (FOR ICD-10 E10.9, E11.9). 08/23/20  Yes Just, Laurita Quint, FNP  dapagliflozin propanediol (FARXIGA) 10 MG TABS tablet Take 1 tablet (10 mg total) by mouth daily. 06/15/20  Yes Jacelyn Pi, Lilia Argue, MD  glimepiride (AMARYL) 4 MG tablet Take 1 tablet (4 mg total) by mouth daily before breakfast. 07/24/20  Yes Jacelyn Pi, Irma M, MD  glucose blood test strip For appropriate meter, check glucose once a day, Dx E11.65 06/15/20  Yes Jacelyn Pi, Irma M, MD  glucose blood test strip Use up to four times daily as needed to check blood sugars daily Please provide patient with covered test strips ( one touch ultra blue was requested) 08/23/20  Yes Just, Laurita Quint, FNP  Lancets (ONETOUCH DELICA PLUS [id number]) [place] USE UP TO FOUR TIMES DAILY AS DIRECTED 06/15/20  Yes Jacelyn Pi, Lilia Argue, MD  metFORMIN (GLUCOPHAGE) 1000 MG tablet Take 1 tablet (1,000 mg total) by mouth 2 (two) times daily with a meal. 06/15/20  Yes Jacelyn Pi, Irma M, MD  rosuvastatin (CRESTOR) 5 MG tablet Take 1 tablet (5 mg total) by mouth daily. Patient taking differently: Take 5 mg by mouth at bedtime. 07/31/20  Yes Jacelyn Pi, Lilia Argue, MD  traMADol Veatrice [place]bon)  50 MG tablet Take 1 tablet (50 mg total) by mouth every 6 (six) hours as needed. 09/06/20 09/06/21 Yes Ralene Ok, MD   Social History   Socioeconomic History  . Marital status: Married    Spouse name: Not on file  . Number of children: 4  . Years of education: Not on file  . Highest education level: Not on file  Occupational History  . Not on file  Tobacco Use  . Smoking status: Never Smoker  . Smokeless tobacco: Never Used  Vaping Use  . Vaping Use: Never used  Substance and Sexual Activity  . Alcohol use: No  . Drug use: No  . Sexual activity: Yes  Other Topics Concern  . Not on file  Social History Narrative  . Not on file   Social Determinants of Health    Financial Resource Strain: Not on file  Food Insecurity: Not on file  Transportation Needs: Not on file  Physical Activity: Not on file  Stress: Not on file  Social Connections: Not on file  Intimate Partner Violence: Not on file    Review of Systems  Constitutional: Negative for fatigue and unexpected weight change.  Eyes: Negative for visual disturbance.  Respiratory: Negative for cough, chest tightness and shortness of breath.   Cardiovascular: Negative for chest pain, palpitations and leg swelling.  Gastrointestinal: Negative for abdominal pain and blood in stool.  Neurological: Negative for dizziness, light-headedness and headaches.   Diabetic Foot Exam - Simple   Simple Foot Form Visual Inspection No deformities, no ulcerations, no other skin breakdown bilaterally: Yes Sensation Testing Intact to touch and monofilament testing bilaterally: Yes Pulse Check Posterior Tibialis and Dorsalis pulse intact bilaterally: Yes Comments Pt foot exam normal, notes some slight decreased sensitivity on his heel of both feet also noted some calluses and thick skin in that area       Objective:   Vitals:   12/06/20 1506  BP: 140/82  Pulse: 85  Resp: 16  Temp: 98 F (36.7 C)  TempSrc: Temporal  SpO2: 96%  Weight: 286 lb 6.4 oz (129.9 kg)  Height: '6\' 2"'  (1.88 m)     Physical Exam Vitals reviewed.  Constitutional:      Appearance: He is well-developed and well-nourished.  HENT:     Head: Normocephalic and atraumatic.  Eyes:     Extraocular Movements: EOM normal.     Pupils: Pupils are equal, round, and reactive to light.  Neck:     Vascular: No carotid bruit or JVD.  Cardiovascular:     Rate and Rhythm: Normal rate and regular rhythm.     Heart sounds: Normal heart sounds. No murmur heard.   Pulmonary:     Effort: Pulmonary effort is normal.     Breath sounds: Normal breath sounds. No rales.  Musculoskeletal:        General: No edema.     Right lower leg: No  edema.     Left lower leg: No edema.  Skin:    General: Skin is warm and dry.  Neurological:     Mental Status: He is alert and oriented to person, place, and time.  Psychiatric:        Mood and Affect: Mood and affect normal.        Assessment & Plan:  Andrew Reeves is a 55 y.o. male . Hyperlipidemia, unspecified hyperlipidemia type - Plan: rosuvastatin (CRESTOR) 5 MG tablet  -Tolerating current regimen, continue same.  Recheck 3 months.  Uncontrolled type 2 diabetes mellitus with hyperglycemia (HCC) - Plan: glimepiride (AMARYL) 2 MG tablet, Amb ref to Medical Nutrition Therapy-MNT, metFORMIN (GLUCOPHAGE) 1000 MG tablet, dapagliflozin propanediol (FARXIGA) 10 MG TABS tablet, glimepiride (AMARYL) 4 MG tablet, rosuvastatin (CRESTOR) 5 MG tablet  -Uncontrolled with worsening A1c recently.  Was drinking significant amount of sodas.  Plans to cut back on sodas and would like to try other diet approach.  I did discuss need for significant improved control based on current A1c, as well as potential complications of uncontrolled diabetes, and recommended reconsidering GLP-1.  That was declined at this time as well as insulin.  He would like to try diet approach and meet with nutritionist.  Referral placed.  Increase glimepiride by 2 mg, hypoglycemia precautions.  Continue Metformin, Farxiga same doses for now.  Meds ordered this encounter  Medications  . glimepiride (AMARYL) 2 MG tablet    Sig: Take 1 tablet (2 mg total) by mouth daily before breakfast. Add to 293m for total dose 6293mper day.    Dispense:  90 tablet    Refill:  1  . metFORMIN (GLUCOPHAGE) 1000 MG tablet    Sig: Take 1 tablet (1,000 mg total) by mouth 2 (two) times daily with a meal.    Dispense:  180 tablet    Refill:  1  . dapagliflozin propanediol (FARXIGA) 10 MG TABS tablet    Sig: Take 1 tablet (10 mg total) by mouth daily.    Dispense:  90 tablet    Refill:  1  . glimepiride (AMARYL) 4 MG tablet    Sig: Take 1  tablet (4 mg total) by mouth daily before breakfast.    Dispense:  90 tablet    Refill:  1  . rosuvastatin (CRESTOR) 5 MG tablet    Sig: Take 1 tablet (5 mg total) by mouth at bedtime.    Dispense:  90 tablet    Refill:  1   Patient Instructions   Based on your current A1c I would recommend once per week or insulin for improved control. If you want to try cahnge in diet, stopping sodas may have some impact. Keep up the good work avoiding those. I will refer you to nutritionist to work on other diet changes for improved diabetes control. Use pill minder to help rem Increase glimepiride to total 93m43mer day. Continue metformin and farxiga same doses for now.  Recheck A1c in 3 months. Sooner if readings are higher.   If you have lab work done today you will be contacted with your lab results within the next 2 weeks.  If you have not heard from us [place] please contact us.[place] fastest way to get your results is to register for My Chart.   IF you received an x-ray today, you will receive an invoice [facility]. Please contact [facility] at [phone] questions or concerns regarding your invoice.   IF you received labwork today, you will receive an invoice from [facility] contact [facility] at [phone] questions or concerns regarding your invoice.   Our billing staff will not be able to assist you with questions regarding bills from these companies.  You will be contacted with the lab results as soon as they are available. The fastest way to get your results is to activate your My Chart account. Instructions are located on the last page of this paperwork. If you have not heard from us [place] the results in 2 weeks, please contact this  office.         Signed, Merri Ray, MD [facility]

## 2020-12-27 ENCOUNTER — Encounter: Payer: Self-pay | Admitting: Dietician

## 2020-12-27 ENCOUNTER — Other Ambulatory Visit: Payer: Self-pay

## 2020-12-27 ENCOUNTER — Encounter: Payer: 59 | Attending: Family Medicine | Admitting: Dietician

## 2020-12-27 DIAGNOSIS — E119 Type 2 diabetes mellitus without complications: Secondary | ICD-10-CM

## 2020-12-27 NOTE — Progress Notes (Signed)
Diabetes Self-Management Education  Visit Type: First/Initial  Appt. Start Time: 1410 Appt. End Time: 2956  12/27/2020  [name], identified by name and date of birth, is a 55 y.o. male with a diagnosis of Diabetes: Type 2.   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 Works 8-4, sometimes longer when working from home. Pt reports having an alarm to tell them to get up and away from their computer. Pt reports checking BG every morning and every mid day. Pt reports both FBG and CBG (before lunch) are around 130-140. Pt most recent A1c of 9.9, 3 weeks ago. Pt reports trying to walk more consistently, but has struggled. Pt reports drinking about 48 oz of water a day. Pt reports their blood sugar reading were getting higher with mixing diet sodas  Pt reports not snacking much, but states their wife influences their snacking at night some times.  Pt reports their mother is diabetic. Pt reports their work has been stressful, and they have to make sure they get breaks during the day.  Height 6\' 2"  (1.88 m), weight 288 lb 14.4 oz (131 kg). Body mass index is 37.09 kg/m.   Diabetes Self-Management Education - 12/27/20 1419      Visit Information   Visit Type First/Initial      Initial Visit   Diabetes Type Type 2    Are you currently following a meal plan? No    Are you taking your medications as prescribed? Yes    Date Diagnosed 3-4 years ago      Health Coping   How would you rate your overall health? Fair   Fair due to dietary concerns     Psychosocial Assessment   Patient Belief/Attitude about Diabetes Motivated to manage diabetes    Self-management support Family    Other persons present Patient    Patient Concerns Nutrition/Meal planning;Weight Control    Special Needs None    Preferred Learning Style No preference indicated    Learning Readiness Ready    How often do you need to have someone help you when you read  instructions, pamphlets, or other written materials from your doctor or pharmacy? 1 - Never    What is the last grade level you completed in school? Master's in education      Pre-Education Assessment   Patient understands the diabetes disease and treatment process. Needs Instruction    Patient understands incorporating nutritional management into lifestyle. Needs Instruction    Patient undertands incorporating physical activity into lifestyle. Needs Instruction    Patient understands using medications safely. Needs Instruction    Patient understands monitoring blood glucose, interpreting and using results Needs Instruction    Patient understands prevention, detection, and treatment of acute complications. Needs Instruction    Patient understands prevention, detection, and treatment of chronic complications. Needs Instruction    Patient understands how to develop strategies to address psychosocial issues. Needs Instruction    Patient understands how to develop strategies to promote health/change behavior. Needs Instruction      Complications   Last HgB A1C per patient/outside source 9.9 %   12/03/2020   How often do you check your blood sugar? 1-2 times/day    Fasting Blood glucose range (mg/dL) 130-179    Postprandial Blood glucose range (mg/dL) 130-179    Number of hypoglycemic episodes per month 0    Number of hyperglycemic episodes per week 0    Have you had a  dilated eye exam in the past 12 months? Yes    Have you had a dental exam in the past 12 months? Yes    Are you checking your feet? Yes    How many days per week are you checking your feet? 7      Dietary Intake   Breakfast 3 strips [place] bacon, 2 eggs, 1 waffles with strawberries, coffee w/truvia    Snack (morning) none    Lunch [employer] chicken sandwich, fries, arnold palmer    Snack (afternoon) none    Dinner Chicken wings, diet tea, water    Snack (evening) none    Beverage(s) coffee, water, diet tea      Exercise    Exercise Type ADL's;Light (walking / raking leaves)    How many days per week to you exercise? 2    How many minutes per day do you exercise? 30    Total minutes per week of exercise 60      Patient Education   Disease state  Definition of diabetes, type 1 and 2, and the diagnosis of diabetes;Factors that contribute to the development of diabetes    Nutrition management  Carbohydrate counting;Food label reading, portion sizes and measuring food.;Reviewed blood glucose goals for pre and post meals and how to evaluate the patients' food intake on their blood glucose level.;Role of diet in the treatment of diabetes and the relationship between the three main macronutrients and blood glucose level    Physical activity and exercise  Role of exercise on diabetes management, blood pressure control and cardiac health.;Helped patient identify appropriate exercises in relation to his/her diabetes, diabetes complications and other health issue.    Monitoring Purpose and frequency of SMBG.;Taught/discussed recording of test results and interpretation of SMBG.    Psychosocial adjustment Role of stress on diabetes    Personal strategies to promote health Lifestyle issues that need to be addressed for better diabetes care      Individualized Goals (developed by patient)   Nutrition Follow meal plan discussed    Physical Activity Exercise 3-5 times per week    Medications take my medication as prescribed    Monitoring  test my blood glucose as discussed      Post-Education Assessment   Patient understands the diabetes disease and treatment process. Needs Review    Patient understands incorporating nutritional management into lifestyle. Needs Review    Patient undertands incorporating physical activity into lifestyle. Needs Review    Patient understands using medications safely. Needs Review    Patient understands monitoring blood glucose, interpreting and using results Needs Review    Patient understands  prevention, detection, and treatment of acute complications. Needs Review    Patient understands prevention, detection, and treatment of chronic complications. Needs Review    Patient understands how to develop strategies to address psychosocial issues. Needs Review    Patient understands how to develop strategies to promote health/change behavior. Needs Review      Outcomes   Expected Outcomes Demonstrated interest in learning. Expect positive outcomes    Future DMSE 4-6 wks    Program Status Not Completed           Individualized Plan for Diabetes Self-Management Training:   Learning Objective:  Patient will have a greater understanding of diabetes self-management. Patient education plan is to attend individual and/or group sessions per assessed needs and concerns.   Plan:   Patient Instructions  Check your blood sugar twice a day Once, fasting in the morning  Again, 2 hours after you begin to eat a meal  Work towards getting 150 minutes of physical activity per week 30 minutes, 5 days a week.  Work towards eating three meals a day, about 5-6 hours apart!  Begin to recognize carbohydrates in your food choices!  Have 4 carb choices at each meal (60 g).   Begin to build your meals using the proportions of the Balanced Plate. . First, select your carb choice(s) for the meal, and determine how much you should have to equal 4 carb choices (60 g). . Next, select your source of protein to pair with your carb choice(s). . Finally, complete the remaining half of your meal with a variety of non-starchy vegetables.    Expected Outcomes:  Demonstrated interest in learning. Expect positive outcomes  Education material provided: ADA - How to Thrive: A Guide for Your Journey with Diabetes, Meal plan card and My Plate  If problems or questions, patient to contact team via:  Phone and Email  Future DSME appointment: 4-6 wks

## 2020-12-27 NOTE — Patient Instructions (Addendum)
Check your blood sugar twice a day Once, fasting in the morning Again, 2 hours after you begin to eat a meal  Work towards getting 150 minutes of physical activity per week 30 minutes, 5 days a week.  Work towards eating three meals a day, about 5-6 hours apart!  Begin to recognize carbohydrates in your food choices!  Have 4 carb choices at each meal (60 g).   Begin to build your meals using the proportions of the Balanced Plate. . First, select your carb choice(s) for the meal, and determine how much you should have to equal 4 carb choices (60 g). . Next, select your source of protein to pair with your carb choice(s). . Finally, complete the remaining half of your meal with a variety of non-starchy vegetables.

## 2021-02-06 ENCOUNTER — Ambulatory Visit: Payer: 59 | Admitting: Dietician

## 2021-02-11 ENCOUNTER — Other Ambulatory Visit: Payer: Self-pay

## 2021-02-11 ENCOUNTER — Encounter: Payer: Self-pay | Admitting: Dietician

## 2021-02-11 ENCOUNTER — Encounter: Payer: 59 | Attending: Family Medicine | Admitting: Dietician

## 2021-02-11 DIAGNOSIS — E119 Type 2 diabetes mellitus without complications: Secondary | ICD-10-CM | POA: Diagnosis present

## 2021-02-11 NOTE — Patient Instructions (Addendum)
Set an alarm to wake up earlier to give yourself an extra 15-20 minutes for physical activity in the morning.  Try 1% milk, or soy milk and see how they affect your blood sugar.  Add a source of protein to your morning snack of fruit. Use the "Balanced Snack" sheet for ideas.  In the event your blood sugar goes below 70, drink 4oz of OJ or regular soda or eat 3-4 hard candies. Check your blood sugar 15 minutes after, look for values over 100! Eat a balanced meal within 45-60 minutes after this.  Blood sugar goals: Fasting 80-100 After a meal - Under 130

## 2021-02-11 NOTE — Progress Notes (Signed)
Diabetes Self-Management Education  Visit Type: Follow-up  Appt. Start Time: 1630 Appt. End Time: 9562  02/11/2021  [name], identified by name and date of birth, is a 55 y.o. male with a diagnosis of Diabetes: Type 2.   ASSESSMENT Pt has lost two pounds since last visit. Pt reports BG numbers are much better now, has been checking every morning and 2 hours after a meal. FBG- 117-120 CBG- 115-120 Pt states if they eat really sweet food, their BG jumps high. Pt reports lowest BG of upper 90s Pt has been eating chicken without the skin now and was surprised to find that they enjoy it. Pt takes glimiperide before breakfast, takes [place] after breakfast, and metformin after lunch and dinner. Pt went to [facility] last weekend and was concerned their food choices were going to mess up their blood sugar. Pt was able to keep BG in control while traveling. Pt has cut back on eating fried foods, chooses grilled/broiled/air fried instead. Pt has been noticing which kinds of food dramatically affect their blood sugar. Avoiding SSBs. Eating more vegetables.  Work is still stressful, pt is getting up from their computer to [employer] (lay down, watch TV) and working on [employer] their work from their home life. Pt states they still have not been as physically active as they want to. Pt wants to get up early to do it.  Height 6\' 2"  (1.88 m), weight 286 lb 9.6 oz (130 kg). Body mass index is 36.8 kg/m.   Diabetes Self-Management Education - 02/11/21 1642      Visit Information   Visit Type Follow-up      Initial Visit   Diabetes Type Type 2    Are you taking your medications as prescribed? Yes      Complications   How often do you check your blood sugar? 1-2 times/day    Fasting Blood glucose range (mg/dL) 70-129    Postprandial Blood glucose range (mg/dL) 70-129      Dietary Intake   Breakfast Bacon, scrambled eggs with spinach, toast, coffee    Snack (morning)  banana    Lunch [place] burger, baked fries    Snack (afternoon) none    Dinner Sweet potatoes, Fried and baked tenderloins, water    Snack (evening) Bowl of cereal, 2% milk    Beverage(s) water, 2% milk      Exercise   Exercise Type ADL's      Individualized Goals (developed by patient)   Nutrition Follow meal plan discussed    Physical Activity Exercise 3-5 times per week    Medications take my medication as prescribed    Monitoring  test my blood glucose as discussed      Patient Self-Evaluation of Goals - Patient rates self as meeting previously set goals (% of time)   Nutrition 50 - 75 %    Physical Activity < 25%    Medications >75%    Monitoring >75%    Problem Solving 25 - 50%    Reducing Risk 25 - 50%    Health Coping 25 - 50%      Post-Education Assessment   Patient understands the diabetes disease and treatment process. Needs Review    Patient understands incorporating nutritional management into lifestyle. Needs Review    Patient undertands incorporating physical activity into lifestyle. Needs Review    Patient understands using medications safely. Needs Review    Patient understands monitoring blood glucose, interpreting and using results Needs Review  Patient understands prevention, detection, and treatment of acute complications. Needs Review    Patient understands prevention, detection, and treatment of chronic complications. Needs Review    Patient understands how to develop strategies to address psychosocial issues. Needs Review    Patient understands how to develop strategies to promote health/change behavior. Needs Review      Outcomes   Expected Outcomes Demonstrated interest in learning. Expect positive outcomes    Future DMSE 3-4 months    Program Status Not Completed      Subsequent Visit   Since your last visit have you continued or begun to take your medications as prescribed? Yes    Since your last visit have you experienced any weight changes?  Loss    Weight Loss (lbs) 2.5    Since your last visit, are you checking your blood glucose at least once a day? Yes           Individualized Plan for Diabetes Self-Management Training:   Learning Objective:  Patient will have a greater understanding of diabetes self-management. Patient education plan is to attend individual and/or group sessions per assessed needs and concerns.   Plan:   Patient Instructions  Set an alarm to wake up earlier to give yourself an extra 15-20 minutes for physical activity in the morning.  Try 1% milk, or soy milk and see how they affect your blood sugar.  Add a source of protein to your morning snack of fruit. Use the "Balanced Snack" sheet for ideas.  In the event your blood sugar goes below 70, drink 4oz of OJ or regular soda or eat 3-4 hard candies. Check your blood sugar 15 minutes after, look for values over 100! Eat a balanced meal within 45-60 minutes after this.  Blood sugar goals: Fasting 80-100 After a meal - Under 130   Expected Outcomes:  Demonstrated interest in learning. Expect positive outcomes  Education material provided: Snack sheet  If problems or questions, patient to contact team via:  Phone and Email  Future DSME appointment: 3-4 months

## 2021-03-01 ENCOUNTER — Other Ambulatory Visit: Payer: Self-pay

## 2021-03-01 ENCOUNTER — Ambulatory Visit (INDEPENDENT_AMBULATORY_CARE_PROVIDER_SITE_OTHER): Payer: 59 | Admitting: Family Medicine

## 2021-03-01 ENCOUNTER — Encounter: Payer: Self-pay | Admitting: Family Medicine

## 2021-03-01 VITALS — BP 134/74 | HR 68 | Temp 98.2°F | Resp 17 | Ht 74.0 in | Wt 281.6 lb

## 2021-03-01 DIAGNOSIS — E785 Hyperlipidemia, unspecified: Secondary | ICD-10-CM

## 2021-03-01 DIAGNOSIS — E669 Obesity, unspecified: Secondary | ICD-10-CM | POA: Diagnosis not present

## 2021-03-01 DIAGNOSIS — E1165 Type 2 diabetes mellitus with hyperglycemia: Secondary | ICD-10-CM | POA: Diagnosis not present

## 2021-03-01 DIAGNOSIS — Z23 Encounter for immunization: Secondary | ICD-10-CM | POA: Diagnosis not present

## 2021-03-01 LAB — COMPREHENSIVE METABOLIC PANEL
ALT: 21 U/L (ref 0–53)
AST: 20 U/L (ref 0–37)
Albumin: 4.1 g/dL (ref 3.5–5.2)
Alkaline Phosphatase: 47 U/L (ref 39–117)
BUN: 15 mg/dL (ref 6–23)
CO2: 28 mEq/L (ref 19–32)
Calcium: 8.8 mg/dL (ref 8.4–10.5)
Chloride: 102 mEq/L (ref 96–112)
Creatinine, Ser: 1.2 mg/dL (ref 0.40–1.50)
GFR: 67.21 mL/min (ref >60.00)
Glucose, Bld: 107 mg/dL — ABNORMAL HIGH (ref 70–99)
Potassium: 4 mEq/L (ref 3.5–5.1)
Sodium: 138 mEq/L (ref 135–145)
Total Bilirubin: 0.7 mg/dL (ref 0.2–1.2)
Total Protein: 6.9 g/dL (ref 6.0–8.3)

## 2021-03-01 LAB — LIPID PANEL
Cholesterol: 134 mg/dL (ref 0–200)
HDL: 32.9 mg/dL — ABNORMAL LOW (ref >39.00)
NonHDL: 101.17
Total CHOL/HDL Ratio: 4
Triglycerides: 266 mg/dL — ABNORMAL HIGH (ref 0.0–149.0)
VLDL: 53.2 mg/dL — ABNORMAL HIGH (ref 0.0–40.0)

## 2021-03-01 LAB — HEMOGLOBIN A1C: Hgb A1c MFr Bld: 8 % — ABNORMAL HIGH (ref 4.6–6.5)

## 2021-03-01 LAB — LDL CHOLESTEROL, DIRECT: Direct LDL: 63 mg/dL

## 2021-03-01 NOTE — Patient Instructions (Signed)
Great job on the diet changes!  I will check your hemoglobin A1c diabetic and medication changes but none for now.  Recheck in 3 months but let me know if there are questions sooner

## 2021-03-01 NOTE — Progress Notes (Signed)
Subjective:  Patient ID: Andrew Reeves, male    DOB: 27-Dec-1965  Age: 55 y.o. MRN: [id number]  CC:  Chief Complaint  Patient presents with  . Diabetes    Pt here today to do 3 month follow up, doing well, is fasting this morning for labs. No concerns, pt did see nutritionist as referred last ov has seen them 2 times feels this has really helped him.     HPI Andrew Reeves presents for   Diabetes: With hyperglycemia.  Prior on metformin, farxiga, glipizide. trulicity was too costly. Now up to 58m glipizide.  Elevated A1c in February. Some sodas. - has cut back, met with nutritionist -helpful.  Fasting: 110-112 Postprandial - 120-130.  no symptomatic lows.  On statin - crestor 562mqod.   Microalbumin: normal ratio 12/03/20.  Optho, foot exam, pneumovax: up to date.   Lab Results  Component Value Date   HGBA1C 9.9 (H) 12/03/2020   HGBA1C 8.7 (A) 08/31/2020   HGBA1C 9.6 (H) 07/20/2020   Lab Results  Component Value Date   LDLCALC 61 08/31/2020   CREATININE 1.10 08/31/2020   Hyperlipidemia: crestor 47m16mod. No new myalgias/side effects. Sluggish if daily use. Ok QOD.  Lab Results  Component Value Date   CHOL 150 08/31/2020   HDL 36 (L) 08/31/2020   LDLCALC 61 08/31/2020   TRIG 341 (H) 08/31/2020   CHOLHDL 4.2 08/31/2020   Lab Results  Component Value Date   ALT 29 08/31/2020   AST 20 08/31/2020   ALKPHOS 64 08/31/2020   BILITOT 0.5 08/31/2020       History Patient Active Problem List   Diagnosis Date Noted  . Hypertriglyceridemia 01/01/2018  . Diabetes (HCCOpp6/12/2016   Past Medical History:  Diagnosis Date  . Diabetes mellitus without complication ([facility]CCLititz  TYPE 2  . Hyperlipidemia    Past Surgical History:  Procedure Laterality Date  . HERNIA REPAIR    . LIPOMA EXCISION Left 09/06/2020   Procedure: EXCISION OF BACK LIPOMA;  Surgeon: RamRalene OkD;  [facility]  Laterality: Left;   No Known Allergies Prior to  Admission medications   Medication Sig Start Date End Date Taking? Authorizing Provider  blood glucose meter kit and supplies Dispense based on patient and insurance preference. Use up to four times daily as directed. (FOR ICD-10 E10.9, E11.9). 08/23/20  Yes Just, KelLaurita QuintNP  dapagliflozin propanediol (FARXIGA) 10 MG TABS tablet Take 1 tablet (10 mg total) by mouth daily. 12/06/20  Yes GreWendie AgresteD  glimepiride (AMARYL) 2 MG tablet Take 1 tablet (2 mg total) by mouth daily before breakfast. Add to 4mg11mr total dose 6mg 64m day. 12/06/20  Yes GreenWendie Agreste glimepiride (AMARYL) 4 MG tablet Take 1 tablet (4 mg total) by mouth daily before breakfast. 12/06/20  Yes GreenWendie Agreste glucose blood test strip Use up to four times daily as needed to check blood sugars daily Please provide patient with covered test strips ( one touch ultra blue was requested) 08/23/20  Yes Just, KelseLaurita Quint  Lancets (ONETOUCH DELICA PLUS [id number] CarlockUP TO FOUR TIMES DAILY AS DIRECTED 06/15/20  Yes SantiJacelyn Pia Lilia Argue metFORMIN (GLUCOPHAGE) 1000 MG tablet Take 1 tablet (1,000 mg total) by mouth 2 (two) times daily with a meal. 12/06/20  Yes GreenWendie Agreste rosuvastatin (CRESTOR) 5 MG tablet Take 1 tablet (5 mg total)  by mouth at bedtime. 12/06/20  Yes Wendie Agreste, MD   Social History   Socioeconomic History  . Marital status: Married    Spouse name: Not on file  . Number of children: 4  . Years of education: Not on file  . Highest education level: Not on file  Occupational History  . Not on file  Tobacco Use  . Smoking status: Never Smoker  . Smokeless tobacco: Never Used  Vaping Use  . Vaping Use: Never used  Substance and Sexual Activity  . Alcohol use: No  . Drug use: No  . Sexual activity: Yes  Other Topics Concern  . Not on file  Social History Narrative  . Not on file   Social Determinants of Health   Financial Resource Strain: Not on file  Food  Insecurity: Not on file  Transportation Needs: Not on file  Physical Activity: Not on file  Stress: Not on file  Social Connections: Not on file  Intimate Partner Violence: Not on file    Review of Systems  Constitutional: Negative for fatigue and unexpected weight change.  Eyes: Negative for visual disturbance.  Respiratory: Negative for cough, chest tightness and shortness of breath.   Cardiovascular: Negative for chest pain, palpitations and leg swelling.  Gastrointestinal: Negative for abdominal pain and blood in stool.  Neurological: Negative for dizziness, light-headedness and headaches.     Objective:   Vitals:   03/01/21 0853  BP: 134/74  Pulse: 68  Resp: 17  Temp: 98.2 F (36.8 C)  TempSrc: Temporal  SpO2: 97%  Weight: 281 lb 9.6 oz (127.7 kg)  Height: '6\' 2"'  (1.88 m)     Physical Exam Vitals reviewed.  Constitutional:      Appearance: He is well-developed.  HENT:     Head: Normocephalic and atraumatic.  Eyes:     Pupils: Pupils are equal, round, and reactive to light.  Neck:     Vascular: No carotid bruit or JVD.  Cardiovascular:     Rate and Rhythm: Normal rate and regular rhythm.     Heart sounds: Normal heart sounds. No murmur heard.   Pulmonary:     Effort: Pulmonary effort is normal.     Breath sounds: Normal breath sounds. No rales.  Skin:    General: Skin is warm and dry.  Neurological:     Mental Status: He is alert and oriented to person, place, and time.     Assessment & Plan:  Andrew Reeves is a 55 y.o. male . Uncontrolled type 2 diabetes mellitus with hyperglycemia (HCC)  Hyperlipidemia, unspecified hyperlipidemia type  Obesity (BMI 35.0-39.9 without comorbidity)  Commended on improved diet, home readings significantly improved.  We will check A1c, continue to monitor diet, activity, exercise for weight loss.  Recheck 3 months.  No med changes for now.  Tolerating every other day dosing of statin, check lipids to decide if  higher dosing needed.  No orders of the defined types were placed in this encounter.  There are no Patient Instructions on file for this visit.    Signed, Merri Ray, MD [facility]

## 2021-05-13 ENCOUNTER — Telehealth: Payer: Self-pay | Admitting: Family Medicine

## 2021-05-13 ENCOUNTER — Ambulatory Visit: Payer: 59 | Admitting: Dietician

## 2021-05-13 NOTE — Telephone Encounter (Signed)
Patient needs a prior authorization submitted for Farxiga - please advise.

## 2021-05-13 NOTE — Telephone Encounter (Signed)
Pt is calling back again I have informed the patient to allow 24 to 48 hours for med turn around. Pt wanted to let the doctor know that he is out of meds.   Pt call back [phone]

## 2021-05-14 ENCOUNTER — Telehealth: Payer: Self-pay | Admitting: Family Medicine

## 2021-05-14 ENCOUNTER — Other Ambulatory Visit: Payer: Self-pay

## 2021-05-14 DIAGNOSIS — E1165 Type 2 diabetes mellitus with hyperglycemia: Secondary | ICD-10-CM

## 2021-05-14 MED ORDER — GLIMEPIRIDE 4 MG PO TABS: 4.0000 mg | ORAL_TABLET | Freq: Every day | ORAL | 1 refills | Status: DC

## 2021-05-14 MED ORDER — GLUCOSE BLOOD VI STRP: ORAL_STRIP | 11 refills | Status: DC

## 2021-05-14 NOTE — Telephone Encounter (Signed)
Pt called in asking for new script of the one touch Derio test strips and the Glimepiride 4mg  to be sent to the [facility] on [address].  Please advise last appt was 03/01/21 and next appt in 06/03/21 with Andrew Reeves.

## 2021-05-14 NOTE — Telephone Encounter (Signed)
Medication was approved and patient's wife notified.

## 2021-05-14 NOTE — Telephone Encounter (Signed)
Prior authorization initiated for expedited review for [place]. Patient's wife notified. Will check website periodically for update. She can be reached at [phone] or [phone].

## 2021-06-03 ENCOUNTER — Ambulatory Visit: Payer: 59 | Admitting: Family Medicine

## 2021-06-08 ENCOUNTER — Other Ambulatory Visit: Payer: Self-pay | Admitting: Family Medicine

## 2021-06-08 DIAGNOSIS — E1165 Type 2 diabetes mellitus with hyperglycemia: Secondary | ICD-10-CM

## 2021-06-10 ENCOUNTER — Other Ambulatory Visit: Payer: Self-pay

## 2021-06-11 ENCOUNTER — Other Ambulatory Visit: Payer: Self-pay | Admitting: Family Medicine

## 2021-06-11 DIAGNOSIS — E1165 Type 2 diabetes mellitus with hyperglycemia: Secondary | ICD-10-CM

## 2021-06-25 ENCOUNTER — Encounter: Payer: 59 | Attending: Family Medicine | Admitting: Dietician

## 2021-06-25 ENCOUNTER — Encounter: Payer: Self-pay | Admitting: Dietician

## 2021-06-25 ENCOUNTER — Ambulatory Visit: Payer: 59 | Admitting: Dietician

## 2021-06-25 DIAGNOSIS — E119 Type 2 diabetes mellitus without complications: Secondary | ICD-10-CM | POA: Diagnosis present

## 2021-06-25 NOTE — Patient Instructions (Addendum)
Continue to focus on increasing your physical activity. Take more walks and use your exercise equipment in the garage as the weather cools down.  Keep up the great work!

## 2021-06-25 NOTE — Progress Notes (Signed)
Diabetes Self-Management Education  Visit Type: Follow-up  Appt. Start Time: 1645 Appt. End Time: [id number]  06/25/2021  [name], identified by name and date of birth, is a 55 y.o. male with a diagnosis of Diabetes:  .   ASSESSMENT Pt is taking Farxiga, Glimepiride, and metformin for their diabetes. Pt reports no hypoglycemic events. Pt reports a week lapse in getting their [place] Glade and Glimperide that caused your blood sugar to elevate during that time period, blood sugar has since returned to previous levels. FBG - 120s CBG -  150-160 Pt has an appointment in two weeks to have their blood work done. Pt reports a weight of 275 this morning. Pt states they have not increased their physical activity as much as they should. Pt has exercise equipment in their garage they can use, but states it gets too hot in there during the summer. Pt has moved work spaces at home to a higher table, which has eliminated some knee discomfort they were dealing with. Pt reports eating leaner meats and fish, and 1% milk. Pt has switched to diet beverages, and drinks more water towards the end of the day.  Height '6\' 2"'$  (1.88 m), weight 275 lb (124.7 kg). Body mass index is 35.31 kg/m.   Diabetes Self-Management Education - 06/25/21 1656       Visit Information   Visit Type Follow-up      Psychosocial Assessment   Self-management support Family      Complications   Last HgB A1C per patient/outside source 8 %   03/01/2021   How often do you check your blood sugar? 1-2 times/day    Fasting Blood glucose range (mg/dL) 70-129    Postprandial Blood glucose range (mg/dL) 130-179      Dietary Intake   Breakfast [place] sausage and grits, cup of coffee    Snack (morning) none    Lunch 3 slices supreme pizza, diet ginger ale    Snack (afternoon) none    Dinner Chicken thighs and rice in chicken broth    Snack (evening) water    Beverage(s) coffee, diet ginger ale, water      Exercise   Exercise Type  ADL's;Light (walking / raking leaves)    How many days per week to you exercise? 1    How many minutes per day do you exercise? 30    Total minutes per week of exercise 30      Individualized Goals (developed by patient)   Physical Activity Exercise 3-5 times per week      Patient Self-Evaluation of Goals - Patient rates self as meeting previously set goals (% of time)   Nutrition 50 - 75 %    Physical Activity 25 - 50%    Medications >75%    Monitoring >75%    Problem Solving 25 - 50%    Reducing Risk 50 - 75 %    Health Coping 50 - 75 %      Post-Education Assessment   Patient understands the diabetes disease and treatment process. Needs Review    Patient understands incorporating nutritional management into lifestyle. Needs Review    Patient undertands incorporating physical activity into lifestyle. Needs Review    Patient understands using medications safely. Needs Review    Patient understands monitoring blood glucose, interpreting and using results Needs Review    Patient understands prevention, detection, and treatment of acute complications. Needs Review    Patient understands prevention, detection, and treatment of chronic complications. Needs Review  Patient understands how to develop strategies to address psychosocial issues. Needs Review    Patient understands how to develop strategies to promote health/change behavior. Needs Review      Outcomes   Expected Outcomes Demonstrated interest in learning. Expect positive outcomes    Future DMSE 2 months    Program Status Not Completed      Subsequent Visit   Since your last visit have you continued or begun to take your medications as prescribed? Yes    Since your last visit have you experienced any weight changes? Loss    Weight Loss (lbs) 10    Since your last visit, are you checking your blood glucose at least once a day? Yes             Individualized Plan for Diabetes Self-Management Training:   Learning  Objective:  Patient will have a greater understanding of diabetes self-management. Patient education plan is to attend individual and/or group sessions per assessed needs and concerns.   Plan:   Patient Instructions  Continue to focus on increasing your physical activity. Take more walks and use your exercise equipment in the garage as the weather cools down.  Keep up the great work!   Expected Outcomes:  Demonstrated interest in learning. Expect positive outcomes  If problems or questions, patient to contact team via:  Phone and Email  Future DSME appointment: 2 months

## 2021-07-10 ENCOUNTER — Ambulatory Visit: Payer: 59 | Admitting: Family Medicine

## 2021-07-10 DIAGNOSIS — E785 Hyperlipidemia, unspecified: Secondary | ICD-10-CM

## 2021-07-10 DIAGNOSIS — E1165 Type 2 diabetes mellitus with hyperglycemia: Secondary | ICD-10-CM

## 2021-08-01 ENCOUNTER — Encounter: Payer: Self-pay | Admitting: Family Medicine

## 2021-08-01 ENCOUNTER — Other Ambulatory Visit (INDEPENDENT_AMBULATORY_CARE_PROVIDER_SITE_OTHER): Payer: 59

## 2021-08-01 ENCOUNTER — Ambulatory Visit (INDEPENDENT_AMBULATORY_CARE_PROVIDER_SITE_OTHER): Payer: 59 | Admitting: Family Medicine

## 2021-08-01 ENCOUNTER — Other Ambulatory Visit: Payer: Self-pay

## 2021-08-01 VITALS — BP 132/70 | HR 67 | Temp 98.2°F | Resp 15 | Ht 74.0 in | Wt 284.0 lb

## 2021-08-01 DIAGNOSIS — E785 Hyperlipidemia, unspecified: Secondary | ICD-10-CM

## 2021-08-01 DIAGNOSIS — E1165 Type 2 diabetes mellitus with hyperglycemia: Secondary | ICD-10-CM

## 2021-08-01 DIAGNOSIS — E559 Vitamin D deficiency, unspecified: Secondary | ICD-10-CM

## 2021-08-01 LAB — COMPREHENSIVE METABOLIC PANEL
ALT: 23 U/L (ref 0–53)
AST: 22 U/L (ref 0–37)
Albumin: 4.4 g/dL (ref 3.5–5.2)
Alkaline Phosphatase: 54 U/L (ref 39–117)
BUN: 14 mg/dL (ref 6–23)
CO2: 29 mEq/L (ref 19–32)
Calcium: 9.1 mg/dL (ref 8.4–10.5)
Chloride: 101 mEq/L (ref 96–112)
Creatinine, Ser: 1.17 mg/dL (ref 0.40–1.50)
GFR: 69.08 mL/min (ref >60.00)
Glucose, Bld: 114 mg/dL — ABNORMAL HIGH (ref 70–99)
Potassium: 3.9 mEq/L (ref 3.5–5.1)
Sodium: 137 mEq/L (ref 135–145)
Total Bilirubin: 0.8 mg/dL (ref 0.2–1.2)
Total Protein: 7.2 g/dL (ref 6.0–8.3)

## 2021-08-01 LAB — LIPID PANEL
Cholesterol: 133 mg/dL (ref 0–200)
HDL: 37.5 mg/dL — ABNORMAL LOW (ref >39.00)
NonHDL: 95.63
Total CHOL/HDL Ratio: 4
Triglycerides: 287 mg/dL — ABNORMAL HIGH (ref 0.0–149.0)
VLDL: 57.4 mg/dL — ABNORMAL HIGH (ref 0.0–40.0)

## 2021-08-01 LAB — VITAMIN D 25 HYDROXY (VIT D DEFICIENCY, FRACTURES): VITD: 18.81 ng/mL — ABNORMAL LOW (ref 30.00–100.00)

## 2021-08-01 LAB — LDL CHOLESTEROL, DIRECT: Direct LDL: 60 mg/dL

## 2021-08-01 LAB — HEMOGLOBIN A1C: Hgb A1c MFr Bld: 8.3 % — ABNORMAL HIGH (ref 4.6–6.5)

## 2021-08-01 MED ORDER — GLIMEPIRIDE 2 MG PO TABS: ORAL_TABLET | ORAL | 1 refills | Status: DC

## 2021-08-01 MED ORDER — DAPAGLIFLOZIN PROPANEDIOL 10 MG PO TABS: 10.0000 mg | ORAL_TABLET | Freq: Every day | ORAL | 1 refills | Status: DC

## 2021-08-01 MED ORDER — METFORMIN HCL 1000 MG PO TABS: 1000.0000 mg | ORAL_TABLET | Freq: Two times a day (BID) | ORAL | 1 refills | Status: DC

## 2021-08-01 MED ORDER — ROSUVASTATIN CALCIUM 5 MG PO TABS: 5.0000 mg | ORAL_TABLET | Freq: Every day | ORAL | 1 refills | Status: DC

## 2021-08-01 MED ORDER — GLIMEPIRIDE 4 MG PO TABS: 4.0000 mg | ORAL_TABLET | Freq: Every day | ORAL | 1 refills | Status: DC

## 2021-08-01 NOTE — Progress Notes (Signed)
Subjective:  Patient ID: Andrew Reeves, male    DOB: May 21, 1966  Age: 55 y.o. MRN: [id number]  CC:  Chief Complaint  Patient presents with   Diabetes    Doing well no concerns may need some refills    Hyperlipidemia    Pt reports some recent refills doing okay no concerns today     HPI Andrew Reeves presents for   Diabetes: Uncontrolled diabetes, complicated by hyperglycemia.  Last visit in May.  Was improving at that time, commended on his improve diet. Treated with every other day statin.  LDL stable in May at 63.metformin 1042m BID,  Glimepiride 628mqd. Farxiga 1029md, no genital infections or uti.  Home readings: Fasting: 120-150 Postprandial : 140-150.  No symptomatic lows. Had been in 90-110 about a month or two ago. Some decreased exercise.    Wt Readings from Last 3 Encounters:  08/01/21 284 lb (128.8 kg)  06/25/21 275 lb (124.7 kg)  03/01/21 281 lb 9.6 oz (127.7 kg)    Microalbumin: normal in 11/2020 Optho, foot exam, pneumovax: up to date.  Plans on flu vaccine and covid booster at pharmacy next few days.   Lab Results  Component Value Date   HGBA1C 8.0 (H) 03/01/2021   HGBA1C 9.9 (H) 12/03/2020   HGBA1C 8.7 (A) 08/31/2020   Lab Results  Component Value Date   LDLCALC 61 08/31/2020   CREATININE 1.20 03/01/2021   Hyperlipidemia: Crestor QOD. No new myalgias.  Lab Results  Component Value Date   CHOL 134 03/01/2021   HDL 32.90 (L) 03/01/2021   LDLCALC 61 08/31/2020   LDLDIRECT 63.0 03/01/2021   TRIG 266.0 (H) 03/01/2021   CHOLHDL 4 03/01/2021   Lab Results  Component Value Date   ALT 21 03/01/2021   AST 20 03/01/2021   ALKPHOS 47 03/01/2021   BILITOT 0.7 03/01/2021      History Patient Active Problem List   Diagnosis Date Noted   Hypertriglyceridemia 01/01/2018   [place]   Past Medical History:  Diagnosis Date   Di[place] ([place]  TYPE 2   Hyperlipidemia    Past Surgical  History:  Procedure Laterality Date   HERNIA REPAIR     LIPOMA EXCISION Left 09/06/2020   Procedure: EXCISION OF BACK LIPOMA;  Surgeon: RamRalene OkD;  [facility]  Laterality: Left;   No Known Allergies Prior to Admission medications   Medication Sig Start Date End Date Taking? Authorizing Provider  blood glucose meter kit and supplies Dispense based on patient and insurance preference. Use up to four times daily as directed. (FOR ICD-10 E10.9, E11.9). 08/23/20  Yes Just, KelLaurita QuintNP  FARXIGA 10 MG TABS tablet Take 1 tablet by mouth once daily 06/11/21  Yes GreWendie AgresteD  glimepiride (AMARYL) 2 MG tablet TAKE 1 TABLET BY MOUTH ONCE DAILY BEFORE BREAKFAST. ADD  TO  4  MG  FOR  TOTAL  DOSE  6  MG  PER  DAY 06/10/21  Yes GreWendie AgresteD  glimepiride (AMARYL) 4 MG tablet Take 1 tablet (4 mg total) by mouth daily before breakfast. 05/14/21  Yes GreWendie AgresteD  glucose blood test strip Use up to four times daily as needed to check blood sugars daily Please provide patient with covered test strips ( one touch ultra blue was requested) 05/14/21  Yes GreWendie AgresteD  Lancets (ONETOUCH DELICA PLUS [id number]  UP TO FOUR TIMES DAILY AS DIRECTED 06/15/20  Yes Jacelyn Pi, Lilia Argue, MD  metFORMIN (GLUCOPHAGE) 1000 MG tablet Take 1 tablet (1,000 mg total) by mouth 2 (two) times daily with a meal. 12/06/20  Yes Wendie Agreste, MD  rosuvastatin (CRESTOR) 5 MG tablet Take 1 tablet (5 mg total) by mouth at bedtime. 12/06/20  Yes Wendie Agreste, MD   Social History   Socioeconomic History   Marital status: Married    Spouse name: Not on file   Number of children: 4   Years of education: Not on file   Highest education level: Not on file  Occupational History   Not on file  Tobacco Use   Smoking status: Never   Smokeless tobacco: Never  Vaping Use   Vaping Use: Never used  Substance and Sexual Activity   Alcohol use: No   Drug use:  No   Sexual activity: Yes  Other Topics Concern   Not on file  Social History Narrative   Not on file   Social Determinants of Health   Financial Resource Strain: Not on file  Food Insecurity: Not on file  Transportation Needs: Not on file  Physical Activity: Not on file  Stress: Not on file  Social Connections: Not on file  Intimate Partner Violence: Not on file    Review of Systems  Constitutional:  Negative for fatigue and unexpected weight change.  Eyes:  Negative for visual disturbance.  Respiratory:  Negative for cough, chest tightness and shortness of breath.   Cardiovascular:  Negative for chest pain, palpitations and leg swelling.  Gastrointestinal:  Negative for abdominal pain and blood in stool.  Neurological:  Negative for dizziness, light-headedness and headaches.    Objective:   Vitals:   08/01/21 1034  BP: 132/70  Pulse: 67  Resp: 15  Temp: 98.2 F (36.8 C)  TempSrc: Temporal  SpO2: 98%  Weight: 284 lb (128.8 kg)  Height: '6\' 2"'  (1.88 m)     Physical Exam Vitals reviewed.  Constitutional:      Appearance: He is well-developed.  HENT:     Head: Normocephalic and atraumatic.  Neck:     Vascular: No carotid bruit or JVD.  Cardiovascular:     Rate and Rhythm: Normal rate and regular rhythm.     Heart sounds: Normal heart sounds. No murmur heard. Pulmonary:     Effort: Pulmonary effort is normal.     Breath sounds: Normal breath sounds. No rales.  Musculoskeletal:     Right lower leg: No edema.     Left lower leg: No edema.  Skin:    General: Skin is warm and dry.  Neurological:     Mental Status: He is alert and oriented to person, place, and time.  Psychiatric:        Mood and Affect: Mood normal.     Assessment & Plan:  Andrew Reeves is a 55 y.o. male . Uncontrolled type 2 diabetes mellitus with hyperglycemia (HCC) - Plan: rosuvastatin (CRESTOR) 5 MG tablet, metFORMIN (GLUCOPHAGE) 1000 MG tablet, dapagliflozin propanediol (FARXIGA)  10 MG TABS tablet, glimepiride (AMARYL) 4 MG tablet, Hemoglobin A1c, Comprehensive metabolic panel, glimepiride (AMARYL) 2 MG tablet  - some improved readings for awhile, higher recently. Check labs. No med changes for now. Increased exercise discussed.   Hyperlipidemia, unspecifed hyperlipidemia type - Plan: rosuvastatin (CRESTOR) 5 MG tablet, Comprehensive metabolic panel, Lipid panel  -  Stable, tolerating current regimen. Medications refilled. Labs pending as above.  Vitamin D deficiency - Plan: Vitamin D (25 hydroxy)  - check labs.   Meds ordered this encounter  Medications   rosuvastatin (CRESTOR) 5 MG tablet    Sig: Take 1 tablet (5 mg total) by mouth at bedtime.    Dispense:  90 tablet    Refill:  1   metFORMIN (GLUCOPHAGE) 1000 MG tablet    Sig: Take 1 tablet (1,000 mg total) by mouth 2 (two) times daily with a meal.    Dispense:  180 tablet    Refill:  1   dapagliflozin propanediol (FARXIGA) 10 MG TABS tablet    Sig: Take 1 tablet (10 mg total) by mouth daily.    Dispense:  90 tablet    Refill:  1   glimepiride (AMARYL) 4 MG tablet    Sig: Take 1 tablet (4 mg total) by mouth daily before breakfast.    Dispense:  90 tablet    Refill:  1   glimepiride (AMARYL) 2 MG tablet    Sig: TAKE 1 TABLET BY MOUTH ONCE DAILY BEFORE BREAKFAST. ADD  TO  4  MG  FOR  TOTAL  DOSE  6  MG  PER  DAY    Dispense:  90 tablet    Refill:  1   Patient Instructions  Try to increase activity/low intensity exercise like walking most days per week with goal of 150 min per week. I will let you know about labs, no changes for now.     Signed,   Merri Ray, MD [place] 08/01/21 12:42 PM

## 2021-08-01 NOTE — Patient Instructions (Signed)
Try to increase activity/low intensity exercise like walking most days per week with goal of 150 min per week. I will let you know about labs, no changes for now.

## 2021-08-01 NOTE — Telephone Encounter (Signed)
Vitamin D collected at appt.

## 2021-08-12 ENCOUNTER — Encounter: Payer: 59 | Attending: Family Medicine | Admitting: Dietician

## 2021-08-12 DIAGNOSIS — E119 Type 2 diabetes mellitus without complications: Secondary | ICD-10-CM | POA: Insufficient documentation

## 2021-09-26 ENCOUNTER — Ambulatory Visit: Payer: 59 | Admitting: Dietician

## 2021-11-01 ENCOUNTER — Ambulatory Visit: Payer: 59 | Admitting: Family Medicine

## 2021-11-11 ENCOUNTER — Ambulatory Visit: Payer: 59 | Admitting: Dietician

## 2021-11-14 ENCOUNTER — Ambulatory Visit: Payer: 59 | Admitting: Family Medicine

## 2021-11-20 ENCOUNTER — Encounter: Payer: Self-pay | Admitting: Family Medicine

## 2021-11-20 ENCOUNTER — Ambulatory Visit (INDEPENDENT_AMBULATORY_CARE_PROVIDER_SITE_OTHER): Payer: Commercial Managed Care - PPO | Admitting: Family Medicine

## 2021-11-20 VITALS — BP 132/80 | HR 79 | Temp 98.3°F | Resp 16 | Ht 74.0 in | Wt 277.6 lb

## 2021-11-20 DIAGNOSIS — E785 Hyperlipidemia, unspecified: Secondary | ICD-10-CM

## 2021-11-20 DIAGNOSIS — E559 Vitamin D deficiency, unspecified: Secondary | ICD-10-CM | POA: Diagnosis not present

## 2021-11-20 DIAGNOSIS — E1165 Type 2 diabetes mellitus with hyperglycemia: Secondary | ICD-10-CM | POA: Diagnosis not present

## 2021-11-20 NOTE — Progress Notes (Signed)
Subjective:  Patient ID: Andrew Reeves, male    DOB: 1966-01-20  Age: 56 y.o. MRN: [id number]  CC:  Chief Complaint  Patient presents with   Diabetes    Doing well no concerns did do fasting lab work today    HPI Andrew Reeves presents for   Diabetes: Complicated by hyperglycemia, last visit in October.  Metformin 1038m bid, Glimepiride 6 mg daily, Farxiga 10 mg daily at that time with lower home readings in the 120-150 fasting, 140-150 postprandials. Increased exercise recommended at his October visit, no med changes. On statin every other day.  Crestor. Fasting now.  Home readings: 99 this morning - up to 120.  Postprandial: 140-150.  No sx lows.  Part time job - eating at 11pm - part time job 3rd shift. Increased walking during day - more exercise.  Eating 3 meals per day. Bedtime 9pm, 6am usually, then alternates sleep on 3 days with part time job, works from home for other days.  Microalbumin: February 2022, normal.  Optho, foot exam, pneumovax:   Ophthalmology: recent visit - 2 months ago. Diabetic exam ok.   Wt Readings from Last 3 Encounters:  11/20/21 277 lb 9.6 oz (125.9 kg)  08/01/21 284 lb (128.8 kg)  06/25/21 275 lb (124.7 kg)    Lab Results  Component Value Date   HGBA1C 8.3 (H) 08/01/2021   HGBA1C 8.0 (H) 03/01/2021   HGBA1C 9.9 (H) 12/03/2020   Lab Results  Component Value Date   LDLCALC 61 08/31/2020   CREATININE 1.17 08/01/2021    Vit D deficiency: Has not started supplement.  Last vitamin D Lab Results  Component Value Date   VD25OH 18.81 (L) 08/01/2021     History Patient Active Problem List   Diagnosis Date Noted   Hypertriglyceridemia 01/01/2018   [place] 03/29/2017   Past Medical History:  Diagnosis Date   Diabetes mellitus without complication ([place]    TYPE 2   Hyperlipidemia    Past Surgical History:  Procedure Laterality Date   HERNIA REPAIR     LIPOMA EXCISION Left 09/06/2020   Procedure: EXCISION OF BACK  LIPOMA;  Surgeon: RRalene Ok MD;  [facility]  Service: General;  Laterality: Left;   No Known Allergies Prior to Admission medications   Medication Sig Start Date End Date Taking? Authorizing Provider  blood glucose meter kit and supplies Dispense based on patient and insurance preference. Use up to four times daily as directed. (FOR ICD-10 E10.9, E11.9). 08/23/20  Yes Just, KLaurita Quint FNP  dapagliflozin propanediol (FARXIGA) 10 MG TABS tablet Take 1 tablet (10 mg total) by mouth daily. 08/01/21  Yes GWendie Agreste MD  glimepiride (AMARYL) 2 MG tablet TAKE 1 TABLET BY MOUTH ONCE DAILY BEFORE BREAKFAST. ADD  TO  4  MG  FOR  TOTAL  DOSE  6  MG  PER  DAY 08/01/21  Yes GWendie Agreste MD  glimepiride (AMARYL) 4 MG tablet Take 1 tablet (4 mg total) by mouth daily before breakfast. 08/01/21  Yes GWendie Agreste MD  glucose blood test strip Use up to four times daily as needed to check blood sugars daily Please provide patient with covered test strips ( one touch ultra blue was requested) 05/14/21  Yes GWendie Agreste MD  Lancets (ONETOUCH DELICA PLUS [id number] MISC USE UP TO FOUR TIMES DAILY AS DIRECTED 06/15/20  Yes SJacelyn Pi ILilia Argue MD  metFORMIN (GLUCOPHAGE) 1000 MG tablet Take 1 tablet (1,000  mg total) by mouth 2 (two) times daily with a meal. 08/01/21  Yes Wendie Agreste, MD  rosuvastatin (CRESTOR) 5 MG tablet Take 1 tablet (5 mg total) by mouth at bedtime. Patient taking differently: Take 5 mg by mouth at bedtime. Takes every 2-3 days not daily 08/01/21  Yes Wendie Agreste, MD   Social History   Socioeconomic History   Marital status: Married    Spouse name: Not on file   Number of children: 4   Years of education: Not on file   Highest education level: Not on file  Occupational History   Not on file  Tobacco Use   Smoking status: Never   Smokeless tobacco: Never  Vaping Use   Vaping Use: Never used  Substance and Sexual Activity   Alcohol use: No   Drug  use: No   Sexual activity: Yes  Other Topics Concern   Not on file  Social History Narrative   Not on file   Social Determinants of Health   Financial Resource Strain: Not on file  Food Insecurity: Not on file  Transportation Needs: Not on file  Physical Activity: Not on file  Stress: Not on file  Social Connections: Not on file  Intimate Partner Violence: Not on file    Review of Systems  Constitutional:  Negative for fatigue and unexpected weight change.  Eyes:  Negative for visual disturbance.  Respiratory:  Negative for cough, chest tightness and shortness of breath.   Cardiovascular:  Negative for chest pain, palpitations and leg swelling.  Gastrointestinal:  Negative for abdominal pain and blood in stool.  Neurological:  Negative for dizziness, light-headedness and headaches.    Objective:   Vitals:   11/20/21 1609  BP: 132/80  Pulse: 79  Resp: 16  Temp: 98.3 F (36.8 C)  TempSrc: Temporal  SpO2: 96%  Weight: 277 lb 9.6 oz (125.9 kg)  Height: '6\' 2"'  (1.88 m)     Physical Exam Vitals reviewed.  Constitutional:      Appearance: He is well-developed.  HENT:     Head: Normocephalic and atraumatic.  Neck:     Vascular: No carotid bruit or JVD.  Cardiovascular:     Rate and Rhythm: Normal rate and regular rhythm.     Heart sounds: Normal heart sounds. No murmur heard. Pulmonary:     Effort: Pulmonary effort is normal.     Breath sounds: Normal breath sounds. No rales.  Musculoskeletal:     Right lower leg: No edema.     Left lower leg: No edema.  Skin:    General: Skin is warm and dry.  Neurological:     Mental Status: He is alert and oriented to person, place, and time.  Psychiatric:        Mood and Affect: Mood normal.       Assessment & Plan:  Andrew Reeves is a 56 y.o. male . Uncontrolled type 2 diabetes mellitus with hyperglycemia ([place]) - Plan: Comprehensive metabolic panel, Hemoglobin A1c  -Commended on weight loss.  Anticipate  improvement in readings.  Check A1c, labs.  No med changes for now.  Watch for hypoglycemia with RTC precautions.  Vitamin D deficiency - Plan: Vitamin D (25 hydroxy)  -Check levels, but has not yet started over-the-counter treatment.  Recommended 2000 units/day for now.  Hyperlipidemia, unspecified hyperlipidemia type - Plan: Lipid panel Consider increasing frequency of statin., check updated lipids.  No orders of the defined types were placed in this encounter.  Patient Instructions  Keep up the good work with weight loss. Congrats! I expect labs to be better. If still elevated, can discuss other options.  Take 2000 units vitamin D per day.  Take care!      Signed,   Merri Ray, MD [place] 11/20/21 4:45 PM

## 2021-11-20 NOTE — Patient Instructions (Addendum)
Keep up the good work with weight loss. Congrats! I expect labs to be better. If still elevated, can discuss other options.  Take 2000 units vitamin D per day.  Take care!

## 2021-11-21 ENCOUNTER — Other Ambulatory Visit (INDEPENDENT_AMBULATORY_CARE_PROVIDER_SITE_OTHER): Payer: Commercial Managed Care - PPO

## 2021-11-21 DIAGNOSIS — E785 Hyperlipidemia, unspecified: Secondary | ICD-10-CM | POA: Diagnosis not present

## 2021-11-21 DIAGNOSIS — E559 Vitamin D deficiency, unspecified: Secondary | ICD-10-CM | POA: Diagnosis not present

## 2021-11-21 LAB — COMPREHENSIVE METABOLIC PANEL
ALT: 29 U/L (ref 0–53)
AST: 28 U/L (ref 0–37)
Albumin: 4.3 g/dL (ref 3.5–5.2)
Alkaline Phosphatase: 59 U/L (ref 39–117)
BUN: 14 mg/dL (ref 6–23)
CO2: 28 mEq/L (ref 19–32)
Calcium: 9.1 mg/dL (ref 8.4–10.5)
Chloride: 103 mEq/L (ref 96–112)
Creatinine, Ser: 1.09 mg/dL (ref 0.40–1.50)
GFR: 75.05 mL/min (ref >60.00)
Glucose, Bld: 95 mg/dL (ref 70–99)
Potassium: 4.2 mEq/L (ref 3.5–5.1)
Sodium: 138 mEq/L (ref 135–145)
Total Bilirubin: 0.9 mg/dL (ref 0.2–1.2)
Total Protein: 6.8 g/dL (ref 6.0–8.3)

## 2021-11-21 LAB — LIPID PANEL
Cholesterol: 149 mg/dL (ref 0–200)
HDL: 39.5 mg/dL (ref >39.00)
LDL Cholesterol: 75 mg/dL (ref 0–99)
NonHDL: 109.57
Total CHOL/HDL Ratio: 4
Triglycerides: 172 mg/dL — ABNORMAL HIGH (ref 0.0–149.0)
VLDL: 34.4 mg/dL (ref 0.0–40.0)

## 2021-11-21 LAB — VITAMIN D 25 HYDROXY (VIT D DEFICIENCY, FRACTURES): VITD: 18.56 ng/mL — ABNORMAL LOW (ref 30.00–100.00)

## 2021-11-21 LAB — HEMOGLOBIN A1C: Hgb A1c MFr Bld: 8.3 % — ABNORMAL HIGH (ref 4.6–6.5)

## 2021-11-26 ENCOUNTER — Encounter: Payer: Self-pay | Admitting: Registered Nurse

## 2021-11-26 ENCOUNTER — Telehealth (INDEPENDENT_AMBULATORY_CARE_PROVIDER_SITE_OTHER): Payer: Commercial Managed Care - PPO | Admitting: Registered Nurse

## 2021-11-26 VITALS — BP 123/82 | HR 91 | Temp 97.7°F

## 2021-11-26 DIAGNOSIS — R1114 Bilious vomiting: Secondary | ICD-10-CM

## 2021-11-26 DIAGNOSIS — R6889 Other general symptoms and signs: Secondary | ICD-10-CM | POA: Diagnosis not present

## 2021-11-26 MED ORDER — OSELTAMIVIR PHOSPHATE 75 MG PO CAPS: 75.0000 mg | ORAL_CAPSULE | Freq: Two times a day (BID) | ORAL | 0 refills | Status: DC

## 2021-11-26 MED ORDER — ONDANSETRON HCL 4 MG PO TABS: 4.0000 mg | ORAL_TABLET | Freq: Three times a day (TID) | ORAL | 0 refills | Status: DC | PRN

## 2021-11-26 NOTE — Progress Notes (Signed)
Telemedicine Encounter- SOAP NOTE Established Patient  This telephone encounter was conducted with the patient's (or proxy's) verbal consent via audio telecommunications: yes/no: Yes Patient was instructed to have this encounter in a suitably private space; and to only have persons present to whom they give permission to participate. In addition, patient identity was confirmed by use of name plus two identifiers (DOB and address).  I discussed the limitations, risks, security and privacy concerns of performing an evaluation and management service by telephone and the availability of in person appointments. I also discussed with the patient that there may be a patient responsible charge related to this service. The patient expressed understanding and agreed to proceed.  I spent a total of 16 minutes talking with the patient or their proxy.  Patient at home Provider in office  Participants: Kathrin Ruddy, NP and Mikel Cella  Chief Complaint  Patient presents with   Generalized Body Aches    Patient states he has started feeling bad yesterday and has had a low grade fever, vomiting and body aches.    Subjective   Andrew Reeves is a 56 y.o. established patient. Telephone visit today for flu like symptoms  HPI Onset yesterday with vomiting one time, some aches. Today more vomiting, aches all over.  Low grade temp. No blood in vomit. No diarrhea.  No shob, doe, chest pain, sensory changes  Glucose at home 120 this morning.   Patient Active Problem List   Diagnosis Date Noted   Hypertriglyceridemia 01/01/2018   Diabetes ([place]) 03/29/2017    Past Medical History:  Diagnosis Date   Diabetes mellitus without complication ([place])    TYPE 2   Hyperlipidemia     Current Outpatient Medications  Medication Sig Dispense Refill   blood glucose meter kit and supplies Dispense based on patient and insurance preference. Use up to four times daily as directed. (FOR ICD-10 E10.9,  E11.9). 1 each 0   dapagliflozin propanediol (FARXIGA) 10 MG TABS tablet Take 1 tablet (10 mg total) by mouth daily. 90 tablet 1   glimepiride (AMARYL) 2 MG tablet TAKE 1 TABLET BY MOUTH ONCE DAILY BEFORE BREAKFAST. ADD  TO  4  MG  FOR  TOTAL  DOSE  6  MG  PER  DAY 90 tablet 1   glimepiride (AMARYL) 4 MG tablet Take 1 tablet (4 mg total) by mouth daily before breakfast. 90 tablet 1   glucose blood test strip Use up to four times daily as needed to check blood sugars daily Please provide patient with covered test strips ( one touch ultra blue was requested) 100 each 11   Lancets (ONETOUCH DELICA PLUS [id number]) MISC USE UP TO FOUR TIMES DAILY AS DIRECTED 100 each 11   metFORMIN (GLUCOPHAGE) 1000 MG tablet Take 1 tablet (1,000 mg total) by mouth 2 (two) times daily with a meal. 180 tablet 1   ondansetron (ZOFRAN) 4 MG tablet Take 1 tablet (4 mg total) by mouth every 8 (eight) hours as needed for nausea or vomiting. 20 tablet 0   oseltamivir (TAMIFLU) 75 MG capsule Take 1 capsule (75 mg total) by mouth 2 (two) times daily. 10 capsule 0   rosuvastatin (CRESTOR) 5 MG tablet Take 1 tablet (5 mg total) by mouth at bedtime. (Patient taking differently: Take 5 mg by mouth at bedtime. Takes every 2-3 days not daily) 90 tablet 1   No current facility-administered medications for this visit.    No Known Allergies  Social History  Socioeconomic History   Marital status: Married    Spouse name: Not on file   Number of children: 4   Years of education: Not on file   Highest education level: Not on file  Occupational History   Not on file  Tobacco Use   Smoking status: Never   Smokeless tobacco: Never  Vaping Use   Vaping Use: Never used  Substance and Sexual Activity   Alcohol use: No   Drug use: No   Sexual activity: Yes  Other Topics Concern   Not on file  Social History Narrative   Not on file   Social Determinants of Health   Financial Resource Strain: Not on file  Food Insecurity:  Not on file  Transportation Needs: Not on file  Physical Activity: Not on file  Stress: Not on file  Social Connections: Not on file  Intimate Partner Violence: Not on file    ROS Per hpi   Objective   Vitals as reported by the patient: Today's Vitals   11/26/21 0912  BP: 123/82  Pulse: 91  Temp: 97.7 F (36.5 C)  TempSrc: Temporal  SpO2: 96%    Cohl was seen today for generalized body aches.  Diagnoses and all orders for this visit:  Bilious vomiting with nausea -     ondansetron (ZOFRAN) 4 MG tablet; Take 1 tablet (4 mg total) by mouth every 8 (eight) hours as needed for nausea or vomiting.  Flu-like symptoms -     oseltamivir (TAMIFLU) 75 MG capsule; Take 1 capsule (75 mg total) by mouth 2 (two) times daily.    PLAN Pt with flu like symptoms. Discussed this vs. Viral gastroenteritis - no sick contacts. Will treat with tamiflu and zofran as above. Recommend tylenol 1026m po tid for duration of symptoms Supportive care with aggressive hydration and rest. Return if worsening or failing to improve Work note given for 1/30-2/1 - can extend through 2/3 if symptoms not resolved.  I discussed the assessment and treatment plan with the patient. The patient was provided an opportunity to ask questions and all were answered. The patient agreed with the plan and demonstrated an understanding of the instructions.   The patient was advised to call back or seek an in-person evaluation if the symptoms worsen or if the condition fails to improve as anticipated.  I provided 16 minutes of non-face-to-face time during this encounter.  RMaximiano Coss NP

## 2021-11-26 NOTE — Patient Instructions (Addendum)
Mr. Condrey -   Doristine Devoid to speak with you, sorry you're not feeling well.  Take tamiflu as instructed. Take zofran as instructed as needed. Hydrate aggressively - ideally around 1 gallon of water throughout the day every day Get plenty of rest Tylenol 1000mg  every eight hours or so.  Call if worsening or failing to improve  Thank you  Rich     If you have lab work done today you will be contacted with your lab results within the next 2 weeks.  If you have not heard from [place] then please contact us. The fastest way to get your results is to register for My Chart.   IF you received an x-ray today, you will receive an invoice from [facility]. Please contact [facility] at [phone] with questions or concerns regarding your invoice.   IF you received labwork today, you will receive an invoice from [place]. Please contact [facility] at [phone] with questions or concerns regarding your invoice.   Our billing staff will not be able to assist you with questions regarding bills from these companies.  You will be contacted with the lab results as soon as they are available. The fastest way to get your results is to activate your My Chart account. Instructions are located on the last page of this paperwork. If you have not heard from [place] regarding the results in 2 weeks, please contact this office.

## 2021-12-02 ENCOUNTER — Ambulatory Visit: Payer: Self-pay | Admitting: Dietician

## 2021-12-03 ENCOUNTER — Other Ambulatory Visit: Payer: Self-pay | Admitting: Family Medicine

## 2021-12-03 DIAGNOSIS — E1165 Type 2 diabetes mellitus with hyperglycemia: Secondary | ICD-10-CM

## 2021-12-03 NOTE — Progress Notes (Signed)
Repeat referral ordered. 

## 2022-01-21 ENCOUNTER — Encounter: Payer: Commercial Managed Care - PPO | Attending: Dietician | Admitting: Dietician

## 2022-01-21 ENCOUNTER — Encounter: Payer: Self-pay | Admitting: Dietician

## 2022-01-21 DIAGNOSIS — E119 Type 2 diabetes mellitus without complications: Secondary | ICD-10-CM | POA: Insufficient documentation

## 2022-01-21 NOTE — Progress Notes (Signed)
Diabetes Self-Management Education ? ?Visit Type: Follow-up ? ?Appt. Start Time: 1600 Appt. End Time: 1975 ? ?01/21/2022 ? ?[name], identified by name and date of birth, is a 56 y.o. male with a diagnosis of Diabetes:  .  ? ?ASSESSMENT ?Visit was conducted on the [employer]. ?Pt is still taking Farxiga and Glimepiride QD, and Metformin BID. ?Pt states that they are surprised that their A1c has not gone down more than it has. Last A1c was 8.3 at the end of January. Pt will get it checked again at the end of April. 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 Pt reports sweating a lot at work, tries to eat salty snacks and drinks Gatorade. Pt states they could stand to drink more water. ?Pt is still working from home M-F ~9:00 am - 4:00 pm.  ?Pt reports normal meal schedule during the day, and will eat 2-3 snacks while working overnight. Pt is checking FBG, ranges between 100 - 120. No change in FBG on days when they are working overnight compared to sleeping overnight. ?Pt takes a daily MV around 11:00 PM. ?Pt reports sleeping very well on the nights that they are not working. ? ?There were no vitals taken for this visit. ?There is no height or weight on file to calculate BMI. ? ? Diabetes Self-Management Education - 01/21/22 1610   ? ?  ? Visit Information  ? Visit Type Follow-up   ?  ? Complications  ? Last HgB A1C per patient/outside source 8.3 %   11/20/2021  ? How often do you check your blood sugar? 1-2 times/day   ? Fasting Blood glucose range (mg/dL) 70-129   ?  ? Dietary Intake  ? Breakfast Pancakes, [place] bacon, 1 egg, coffee w/splenda & non-fat creamer, 8 oz OJ   ? Lunch Shrimp salad, cranberry juice   ? Dinner BBQ chicken thigh sandwich, gatorade   ? Snack (evening) 1/2 PB and Jelly, gatorade ----- other 1/2 PB and J, potato chips   ? Beverage(s) Gatorade   ?  ? Exercise  ? Exercise Type ADL's;Light (walking / raking  leaves);Moderate (swimming / aerobic walking)   Pt started 3rd shift job that's very active  ?  ? Individualized Goals (developed by patient)  ? Nutrition General guidelines for healthy choices and portions discussed   Lower SSB consumption  ? Reducing Risk treat hypoglycemia with 15 grams of carbs if blood glucose less than '70mg'$ /dL   ?  ? Patient Self-Evaluation of Goals - Patient rates self as meeting previously set goals (% of time)  ? Nutrition 50 - 75 %   ? Physical Activity 50 - 75 %   ? Medications >75%   ? Monitoring >75%   ? Problem Solving 25 - 50%   ? Reducing Risk 50 - 75 %   ? Health Coping 25 - 50%   ?  ? Post-Education Assessment  ? Patient understands the diabetes disease and treatment process. Needs Review   ? Patient understands incorporating nutritional management into lifestyle. Needs Review   ? Patient undertands incorporating physical activity into lifestyle. Needs Review   ? Patient understands using medications safely. Demonstrates understanding / competency   ? Patient understands monitoring blood glucose, interpreting and using results Demonstrates understanding / competency   ? Patient understands prevention, detection, and treatment of acute complications. Needs Review   ? Patient understands prevention, detection, and  treatment of chronic complications. Needs Review   ? Patient understands how to develop strategies to address psychosocial issues. Needs Review   ? Patient understands how to develop strategies to promote health/change behavior. Needs Review   ?  ? Outcomes  ? Expected Outcomes Demonstrated interest in learning. Expect positive outcomes   ? Future DMSE 3-4 months   ? Program Status Not Completed   ?  ? Subsequent Visit  ? Since your last visit have you continued or begun to take your medications as prescribed? Yes   ? Since your last visit have you had your blood pressure checked? Yes   ? Is your most recent blood pressure lower, unchanged, or higher since your last visit?  Lower   ? Since your last visit have you experienced any weight changes? Gain   ? Weight Gain (lbs) 2   ? Since your last visit, are you checking your blood glucose at least once a day? Yes   ? ?  ?  ? ?  ? ? ?Individualized Plan for Diabetes Self-Management Training:  ? ?Learning Objective:  Patient will have a greater understanding of diabetes self-management. ?Patient education plan is to attend individual and/or group sessions per assessed needs and concerns. ?  ?Plan:  ? ?Patient Instructions  ?Work on [employer] choices to increase plain water and decrease sugary beverages. Work towards a goal of 64 oz of plain water ? ?Switch from Gatorade to Gatorade ZERO. Try some sugar free powder packets like Crystal Lite, or Rehydration packs (Liquid IV) to mix in with a bottle of water. ? ?Consider [place] brand protein pancakes for a higher protein breakfast option. Consider switching to a sugar-free syrups. ? ?Consider having your PB and J on whole wheat bread and looking into sugar free jellies or jams. ? ?Expected Outcomes:  Demonstrated interest in learning. Expect positive outcomes ? ?If problems or questions, patient to contact team via:  Phone and Email ? ?Future DSME appointment: 3-4 months ?

## 2022-01-21 NOTE — Patient Instructions (Addendum)
Work on [employer] choices to increase plain water and decrease sugary beverages. Work towards a goal of 64 oz of plain water ? ?Switch from Gatorade to Gatorade ZERO. Try some sugar free powder packets like Crystal Lite, or Rehydration packs (Liquid IV) to mix in with a bottle of water. ? ?Consider [place] brand protein pancakes for a higher protein breakfast option. Consider switching to a sugar-free syrups. ? ?Consider having your PB and J on whole wheat bread and looking into sugar free jellies or jams. ?

## 2022-02-19 ENCOUNTER — Ambulatory Visit: Payer: Commercial Managed Care - PPO | Admitting: Family Medicine

## 2022-02-26 ENCOUNTER — Other Ambulatory Visit: Payer: Self-pay | Admitting: Family Medicine

## 2022-02-26 DIAGNOSIS — E1165 Type 2 diabetes mellitus with hyperglycemia: Secondary | ICD-10-CM

## 2022-03-10 ENCOUNTER — Ambulatory Visit (INDEPENDENT_AMBULATORY_CARE_PROVIDER_SITE_OTHER): Payer: Commercial Managed Care - PPO | Admitting: Family Medicine

## 2022-03-10 VITALS — BP 122/74 | HR 76 | Temp 98.1°F | Resp 16 | Ht 74.0 in | Wt 273.8 lb

## 2022-03-10 DIAGNOSIS — E785 Hyperlipidemia, unspecified: Secondary | ICD-10-CM | POA: Diagnosis not present

## 2022-03-10 DIAGNOSIS — E669 Obesity, unspecified: Secondary | ICD-10-CM | POA: Diagnosis not present

## 2022-03-10 DIAGNOSIS — E559 Vitamin D deficiency, unspecified: Secondary | ICD-10-CM

## 2022-03-10 DIAGNOSIS — E1165 Type 2 diabetes mellitus with hyperglycemia: Secondary | ICD-10-CM

## 2022-03-10 MED ORDER — GLIMEPIRIDE 4 MG PO TABS: 4.0000 mg | ORAL_TABLET | Freq: Every day | ORAL | 1 refills | Status: DC

## 2022-03-10 MED ORDER — DAPAGLIFLOZIN PROPANEDIOL 10 MG PO TABS: 10.0000 mg | ORAL_TABLET | Freq: Every day | ORAL | 1 refills | Status: DC

## 2022-03-10 MED ORDER — GLIMEPIRIDE 2 MG PO TABS: ORAL_TABLET | ORAL | 1 refills | Status: DC

## 2022-03-10 MED ORDER — METFORMIN HCL 1000 MG PO TABS: 1000.0000 mg | ORAL_TABLET | Freq: Two times a day (BID) | ORAL | 1 refills | Status: DC

## 2022-03-10 NOTE — Patient Instructions (Addendum)
I do recommend restarting crestor most days per week. Take at night and if any sedation during day, follow and we can discuss options.  ?Same meds for now, unless the labs are still elevated - I will let you know. Keep up the good work with weight loss! ?We can check vitamin D next time, continue 1000-'2000mg'$  per day supplement.  ?Thanks for coming in today.  ? ? ? ?

## 2022-03-10 NOTE — Progress Notes (Signed)
? ?Subjective:  ?Patient ID: Andrew Reeves, male    DOB: [birth year]  Age: 56 y.o. MRN: [id number] ? ?CC:  ?Chief Complaint  ?Patient presents with  ? Diabetes  ?  Pt here for 3 month recheck, notes due for refills   ? Hyperlipidemia  ?  Pt notes he is not taking statin at this time where he had been taking 3-4 times a week previously   ? ? ?HPI ?Andrew Reeves presents for  ?Diabetes: ?Complicated by hyperglycemia ?Followed by nutritionist - will be establishing with new nutritionist. Last visit in March ?Commended on his weight loss in January.  Unfortunately A1c was still elevated.  Home monitoring discussed with potential med adjustments if readings over 200. ?Has continued on Farxiga 685m qd, metformin10018mBID, glimepiride 85m68md.  same doses. Lost 4 more pounds. Exercise with evening part time job. No new med side effects.  ?Statin, previously prescribed Crestor.  Had been taking intermittently - few per week. ? Made drowsy, took at night. Off past 30 days.  ?Home readings fasting 110 ?Home readings postprandial/afternoon. 120-130.  ?Symptomatic lows - none. Lowest 70 once few months ago. Felt ok.  ?Microalbumin: Normal ratio 12/03/2020 ?Optho, foot exam, pneumovax: Up-to-date, [place] [facility] [place] his [place].  ? ?Last vitamin D ?Lab Results  ?Component Value Date  ? VD25OH 18.56 (L) 11/21/2021  ?Taking otc vit D supplement past few weeks. Unknown dose.  ? ? ?Wt Readings from Last 3 Encounters:  ?03/10/22 273 lb 12.8 oz (124.2 kg)  ?11/20/21 277 lb 9.6 oz (125.9 kg)  ?08/01/21 284 lb (128.8 kg)  ? ? ?Lab Results  ?Component Value Date  ? HGBA1C 8.3 (H) 11/20/2021  ? HGBA1C 8.3 (H) 08/01/2021  ? HGBA1C 8.0 (H) 03/01/2021  ? ?Lab Results  ?Component Value Date  ? LDLBonita 11/21/2021  ? CREATININE 1.09 11/20/2021  ? ? ?History ?Patient Active Problem List  ? Diagnosis Date Noted  ? Hypertriglyceridemia 01/01/2018  ? [place]  ? ?Past Medical History:  ?Diagnosis Date  ? Diabetes mellitus without complication ([place] ? TYPE 2  ? Hyperlipidemia   ? ?Past Surgical History:  ?Procedure Laterality Date  ? HERNIA REPAIR    ? LIPOMA EXCISION Left 09/06/2020  ? Procedure: EXCISION OF BACK LIPOMA;  Surgeon: RamRalene OkD;  [facility]  Laterality: Left;  ? ?No Known Allergies ?Prior to Admission medications   ?Medication Sig Start Date End Date Taking? Authorizing Provider  ?blood glucose meter kit and supplies Dispense based on patient and insurance preference. Use up to four times daily as directed. (FOR ICD-10 E10.9, E11.9). 08/23/20  Yes Just, KelLaurita QuintNP  ?FARXIGA 10 MG TABS tablet Take 1 tablet by mouth once daily 02/26/22  Yes GreWendie AgresteD  ?glimepiride (AMARYL) 2 MG tablet TAKE 1 TABLET BY MOUTH ONCE DAILY BEFORE BREAKFAST **ADD  TO  4  MG  FOR  TOTAL  DOSE  6  MG  PER  DAY** 02/26/22  Yes GreWendie AgresteD  ?glimepiride (AMARYL) 4 MG tablet Take 1 tablet (4 mg total) by mouth daily before breakfast. 08/01/21  Yes GreWendie AgresteD  ?glucose blood test strip Use up to four times daily as needed to check blood sugars daily ?Please provide patient with covered test strips ( one touch ultra blue was requested) 05/14/21  Yes GreWendie Agreste  MD  ?Lancets (ONETOUCH DELICA PLUS [id number]) [place] USE UP TO FOUR TIMES DAILY AS DIRECTED 06/15/20  Yes Jacelyn Pi, Lilia Argue, MD  ?metFORMIN (GLUCOPHAGE) 1000 MG tablet TAKE 1 TABLET BY MOUTH TWICE DAILY WITH A MEAL 02/26/22  Yes Wendie Agreste, MD  ?ondansetron (ZOFRAN) 4 MG tablet Take 1 tablet (4 mg total) by mouth every 8 (eight) hours as needed for nausea or vomiting. ?Patient not taking: Reported on 03/10/2022 11/26/21   Maximiano Coss, NP  ?rosuvastatin (CRESTOR) 5 MG tablet Take 1 tablet (5 mg total) by mouth at bedtime. ?Patient not taking: Reported on 03/10/2022 08/01/21   Wendie Agreste, MD  ? ?Social History  ? ?Socioeconomic History   ? Marital status: Married  ?  Spouse name: Not on file  ? Number of children: 4  ? Years of education: Not on file  ? Highest education level: Not on file  ?Occupational History  ? Not on file  ?Tobacco Use  ? Smoking status: Never  ? Smokeless tobacco: Never  ?Vaping Use  ? Vaping Use: Never used  ?Substance and Sexual Activity  ? Alcohol use: No  ? Drug use: No  ? Sexual activity: Yes  ?Other Topics Concern  ? Not on file  ?Social History Narrative  ? Not on file  ? ?Social Determinants of Health  ? ?Financial Resource Strain: Not on file  ?Food Insecurity: Not on file  ?Transportation Needs: Not on file  ?Physical Activity: Not on file  ?Stress: Not on file  ?Social Connections: Not on file  ?Intimate Partner Violence: Not on file  ? ? ?Review of Systems  ?Constitutional:  Negative for fatigue and unexpected weight change.  ?Eyes:  Negative for visual disturbance.  ?Respiratory:  Negative for cough, chest tightness and shortness of breath.   ?Cardiovascular:  Negative for chest pain, palpitations and leg swelling.  ?Gastrointestinal:  Negative for abdominal pain and blood in stool.  ?Neurological:  Negative for dizziness, light-headedness and headaches.  ? ? ?Objective:  ? ?Vitals:  ? 03/10/22 1436  ?BP: 122/74  ?Pulse: 76  ?Resp: 16  ?Temp: 98.1 ?F (36.7 ?C)  ?TempSrc: Temporal  ?SpO2: 97%  ?Weight: 273 lb 12.8 oz (124.2 kg)  ?Height: 6' 2" (1.88 m)  ? ? ? ?Physical Exam ?Vitals reviewed.  ?Constitutional:   ?   Appearance: He is well-developed.  ?HENT:  ?   Head: Normocephalic and atraumatic.  ?Neck:  ?   Vascular: No carotid bruit or JVD.  ?Cardiovascular:  ?   Rate and Rhythm: Normal rate and regular rhythm.  ?   Heart sounds: Normal heart sounds. No murmur heard. ?Pulmonary:  ?   Effort: Pulmonary effort is normal.  ?   Breath sounds: Normal breath sounds. No rales.  ?Musculoskeletal:  ?   Right lower leg: No edema.  ?   Left lower leg: No edema.  ?Skin: ?   General: Skin is warm and dry.  ?Neurological:  ?    Mental Status: He is alert and oriented to person, place, and time.  ?Psychiatric:     ?   Mood and Affect: Mood normal.  ? ? ? ? ? ?Assessment & Plan:  ?Andrew Reeves is a 56 y.o. male . ?Uncontrolled type 2 diabetes mellitus with hyperglycemia (HCC) - Plan: Comprehensive metabolic panel, Hemoglobin A1c, Microalbumin / creatinine urine ratio, metFORMIN (GLUCOPHAGE) 1000 MG tablet, glimepiride (AMARYL) 2 MG tablet, glimepiride (AMARYL) 4 MG tablet, dapagliflozin propanediol (FARXIGA) 10 MG TABS tablet ? -  Commended on weight loss, continue diet/exercise changes.  No diabetic changes for now, labs pending as above and can adjust regimen accordingly.  Recheck 3 months ? ?Hyperlipidemia, unspecified hyperlipidemia type - Plan: Lipid panel, Comprehensive metabolic panel ? -Rationale for statin use with diabetes discussed.  Recommended restarting Crestor, evening dosing, RTC precautions if daytime fatigue/somnolence ? ?Vitamin D deficiency ? -Over-the-counter supplementation 1000 2000 units/day with repeat testing at next visit. ? ?Obesity (BMI 35.0-39.9 without comorbidity) ?As above, commended on improvements. ? ?Meds ordered this encounter  ?Medications  ? metFORMIN (GLUCOPHAGE) 1000 MG tablet  ?  Sig: Take 1 tablet (1,000 mg total) by mouth 2 (two) times daily with a meal.  ?  Dispense:  180 tablet  ?  Refill:  1  ? glimepiride (AMARYL) 2 MG tablet  ?  Sig: TAKE 1 TABLET BY MOUTH ONCE DAILY BEFORE BREAKFAST **ADD  TO  4  MG  FOR  TOTAL  DOSE  6  MG  PER  DAY**  ?  Dispense:  90 tablet  ?  Refill:  1  ? glimepiride (AMARYL) 4 MG tablet  ?  Sig: Take 1 tablet (4 mg total) by mouth daily before breakfast.  ?  Dispense:  90 tablet  ?  Refill:  1  ? dapagliflozin propanediol (FARXIGA) 10 MG TABS tablet  ?  Sig: Take 1 tablet (10 mg total) by mouth daily.  ?  Dispense:  90 tablet  ?  Refill:  1  ? ?Patient Instructions  ?I do recommend restarting crestor most days per week. Take at night and if any sedation during day,  follow and we can discuss options.  ?Same meds for now, unless the labs are still elevated - I will let you know. Keep up the good work with weight loss! ?We can check vitamin D next time, continue 1000-200

## 2022-03-11 LAB — COMPREHENSIVE METABOLIC PANEL
ALT: 22 U/L (ref 0–53)
AST: 19 U/L (ref 0–37)
Albumin: 4.3 g/dL (ref 3.5–5.2)
Alkaline Phosphatase: 62 U/L (ref 39–117)
BUN: 15 mg/dL (ref 6–23)
CO2: 26 mEq/L (ref 19–32)
Calcium: 9.3 mg/dL (ref 8.4–10.5)
Chloride: 102 mEq/L (ref 96–112)
Creatinine, Ser: 1.1 mg/dL (ref 0.40–1.50)
GFR: 74.07 mL/min (ref >60.00)
Glucose, Bld: 96 mg/dL (ref 70–99)
Potassium: 4.1 mEq/L (ref 3.5–5.1)
Sodium: 138 mEq/L (ref 135–145)
Total Bilirubin: 0.8 mg/dL (ref 0.2–1.2)
Total Protein: 7 g/dL (ref 6.0–8.3)

## 2022-03-11 LAB — HEMOGLOBIN A1C: Hgb A1c MFr Bld: 7.5 % — ABNORMAL HIGH (ref 4.6–6.5)

## 2022-03-11 LAB — LIPID PANEL
Cholesterol: 189 mg/dL (ref 0–200)
HDL: 40.4 mg/dL (ref >39.00)
Total CHOL/HDL Ratio: 5
Triglycerides: 433 mg/dL — ABNORMAL HIGH (ref 0.0–149.0)

## 2022-03-11 LAB — MICROALBUMIN / CREATININE URINE RATIO
Creatinine,U: 114.7 mg/dL
Microalb Creat Ratio: 2.2 mg/g (ref 0.0–30.0)
Microalb, Ur: 2.5 mg/dL — ABNORMAL HIGH (ref 0.0–1.9)

## 2022-03-11 LAB — LDL CHOLESTEROL, DIRECT: Direct LDL: 80 mg/dL

## 2022-04-17 ENCOUNTER — Ambulatory Visit: Payer: Commercial Managed Care - PPO | Admitting: Dietician

## 2022-06-11 ENCOUNTER — Ambulatory Visit: Payer: Commercial Managed Care - PPO | Admitting: Family Medicine

## 2022-06-13 ENCOUNTER — Other Ambulatory Visit: Payer: Self-pay | Admitting: Lab

## 2022-06-13 ENCOUNTER — Other Ambulatory Visit: Payer: Self-pay | Admitting: Family Medicine

## 2022-06-13 DIAGNOSIS — E1165 Type 2 diabetes mellitus with hyperglycemia: Secondary | ICD-10-CM

## 2022-06-20 ENCOUNTER — Ambulatory Visit: Payer: Commercial Managed Care - PPO | Admitting: Family Medicine

## 2022-06-20 DIAGNOSIS — E1165 Type 2 diabetes mellitus with hyperglycemia: Secondary | ICD-10-CM

## 2022-06-20 DIAGNOSIS — E785 Hyperlipidemia, unspecified: Secondary | ICD-10-CM

## 2022-07-02 ENCOUNTER — Encounter: Payer: Self-pay | Admitting: Family Medicine

## 2022-07-02 ENCOUNTER — Ambulatory Visit (INDEPENDENT_AMBULATORY_CARE_PROVIDER_SITE_OTHER): Payer: Commercial Managed Care - PPO | Admitting: Family Medicine

## 2022-07-02 VITALS — BP 122/78 | HR 61 | Temp 97.9°F | Ht 74.0 in | Wt 272.6 lb

## 2022-07-02 DIAGNOSIS — E785 Hyperlipidemia, unspecified: Secondary | ICD-10-CM

## 2022-07-02 DIAGNOSIS — E559 Vitamin D deficiency, unspecified: Secondary | ICD-10-CM

## 2022-07-02 DIAGNOSIS — E1165 Type 2 diabetes mellitus with hyperglycemia: Secondary | ICD-10-CM | POA: Diagnosis not present

## 2022-07-02 LAB — LIPID PANEL
Cholesterol: 154 mg/dL (ref 0–200)
HDL: 39 mg/dL — ABNORMAL LOW (ref >39.00)
NonHDL: 114.66
Total CHOL/HDL Ratio: 4
Triglycerides: 269 mg/dL — ABNORMAL HIGH (ref 0.0–149.0)
VLDL: 53.8 mg/dL — ABNORMAL HIGH (ref 0.0–40.0)

## 2022-07-02 LAB — COMPREHENSIVE METABOLIC PANEL
ALT: 30 U/L (ref 0–53)
AST: 28 U/L (ref 0–37)
Albumin: 4.2 g/dL (ref 3.5–5.2)
Alkaline Phosphatase: 60 U/L (ref 39–117)
BUN: 16 mg/dL (ref 6–23)
CO2: 26 mEq/L (ref 19–32)
Calcium: 9.3 mg/dL (ref 8.4–10.5)
Chloride: 101 mEq/L (ref 96–112)
Creatinine, Ser: 1.12 mg/dL (ref 0.40–1.50)
GFR: 72.33 mL/min (ref >60.00)
Glucose, Bld: 108 mg/dL — ABNORMAL HIGH (ref 70–99)
Potassium: 4 mEq/L (ref 3.5–5.1)
Sodium: 137 mEq/L (ref 135–145)
Total Bilirubin: 0.8 mg/dL (ref 0.2–1.2)
Total Protein: 7.3 g/dL (ref 6.0–8.3)

## 2022-07-02 LAB — LDL CHOLESTEROL, DIRECT: Direct LDL: 73 mg/dL

## 2022-07-02 LAB — HEMOGLOBIN A1C: Hgb A1c MFr Bld: 7.9 % — ABNORMAL HIGH (ref 4.6–6.5)

## 2022-07-02 LAB — VITAMIN D 25 HYDROXY (VIT D DEFICIENCY, FRACTURES): VITD: 30 ng/mL (ref 30.00–100.00)

## 2022-07-02 MED ORDER — DAPAGLIFLOZIN PROPANEDIOL 10 MG PO TABS: 10.0000 mg | ORAL_TABLET | Freq: Every day | ORAL | 1 refills | Status: DC

## 2022-07-02 MED ORDER — METFORMIN HCL 1000 MG PO TABS: 1000.0000 mg | ORAL_TABLET | Freq: Two times a day (BID) | ORAL | 1 refills | Status: DC

## 2022-07-02 MED ORDER — ROSUVASTATIN CALCIUM 5 MG PO TABS: 5.0000 mg | ORAL_TABLET | Freq: Every day | ORAL | 1 refills | Status: DC

## 2022-07-02 MED ORDER — GLIMEPIRIDE 2 MG PO TABS: ORAL_TABLET | ORAL | 1 refills | Status: DC

## 2022-07-02 MED ORDER — GLIMEPIRIDE 4 MG PO TABS: 4.0000 mg | ORAL_TABLET | Freq: Every day | ORAL | 1 refills | Status: DC

## 2022-07-02 NOTE — Patient Instructions (Signed)
Depending on lab results we can discuss medication changes but none today.  Keep up the good work with exercise, watching diet, recheck in 3 months.  Take care.

## 2022-07-02 NOTE — Progress Notes (Signed)
 Subjective:  Patient ID: Andrew Reeves, male    DOB: 11/03/1965  Age: 56 y.o. MRN: [id number]  CC:  Chief Complaint  Patient presents with   Diabetes    Pt states all is well, pt wants his vitamin D level checked , pt is fasting    HPI Andrew Reeves presents for   Diabetes: Complicated by hyperglycemia, treated by nutritionist previously.  Weight stable. .  Exercise - 5 days per week. Walking.  Treated with Farxiga 10 mg daily, metformin 1000 mg twice daily, glimepiride 6 mg daily. Crestor at night, question drowsiness, stopped at his last visit.  Recommended restart. Has been taking crestor- out for 1 week, but daily prior. No myalgias on meds.  Home readings fasting: 115-120 Postprandial: 160. No 200's. Symptomatic lows:none.  Microalbumin: 2.5 on 03/10/2022 Optho, foot exam, pneumovax: Up-to-date No urinary difficulty, or groin rash.  Flu vaccine planned at pharmacy.  Lab Results  Component Value Date   HGBA1C 7.5 (H) 03/10/2022   HGBA1C 8.3 (H) 11/20/2021   HGBA1C 8.3 (H) 08/01/2021   Lab Results  Component Value Date   MICROALBUR 2.5 (H) 03/10/2022   LDLCALC 75 11/21/2021   CREATININE 1.10 03/10/2022   Wt Readings from Last 3 Encounters:  07/02/22 276 lb 9.6 oz (125.5 kg)  03/10/22 273 lb 12.8 oz (124.2 kg)  11/20/21 277 lb 9.6 oz (125.9 kg)   Vitamin D deficiency Treated with over-the-counter supplementation previously.  Currently taking 1000iu per day.  Last vitamin D Lab Results  Component Value Date   VD25OH 18.56 (L) 11/21/2021      History Patient Active Problem List   Diagnosis Date Noted   Hypertriglyceridemia 01/01/2018   Diabetes (HCC) 03/29/2017   Past Medical History:  Diagnosis Date   Diabetes mellitus without complication (HCC)    TYPE 2   Hyperlipidemia    Past Surgical History:  Procedure Laterality Date   HERNIA REPAIR     LIPOMA EXCISION Left 09/06/2020   Procedure: EXCISION OF BACK LIPOMA;  Surgeon: Ramirez, Armando, MD;   Location: [facility] OR;  Service: General;  Laterality: Left;   No Known Allergies Prior to Admission medications   Medication Sig Start Date End Date Taking? Authorizing Provider  blood glucose meter kit and supplies Dispense based on patient and insurance preference. Use up to four times daily as directed. (FOR ICD-10 E10.9, E11.9). 08/23/20  Yes Just, Kelsea J, FNP  Lancets (ONETOUCH DELICA PLUS LANCET33G) MISC USE UP TO FOUR TIMES DAILY AS DIRECTED 06/15/20  Yes Santiago Lago, Irma M, MD  ONETOUCH VERIO test strip USE UP TO FOUR TIMES DAILY AS NEEDED TO CHECK BLOOD SUGARS DAILY 06/13/22  Yes Greene, Jeffrey R, MD  dapagliflozin propanediol (FARXIGA) 10 MG TABS tablet Take 1 tablet (10 mg total) by mouth daily. 07/02/22   Greene, Jeffrey R, MD  glimepiride (AMARYL) 2 MG tablet TAKE 1 TABLET BY MOUTH ONCE DAILY BEFORE BREAKFAST **ADD  TO  4  MG  FOR  TOTAL  DOSE  6  MG  PER  DAY** 07/02/22   Greene, Jeffrey R, MD  glimepiride (AMARYL) 4 MG tablet Take 1 tablet (4 mg total) by mouth daily before breakfast. 07/02/22   Greene, Jeffrey R, MD  metFORMIN (GLUCOPHAGE) 1000 MG tablet Take 1 tablet (1,000 mg total) by mouth 2 (two) times daily with a meal. 07/02/22   Greene, Jeffrey R, MD  rosuvastatin (CRESTOR) 5 MG tablet Take 1 tablet (5 mg total) by mouth at bedtime.   07/02/22   Wendie Agreste, MD   Social History   Socioeconomic History   Marital status: Married    Spouse name: Not on file   Number of children: 4   Years of education: Not on file   Highest education level: Not on file  Occupational History   Not on file  Tobacco Use   Smoking status: Never   Smokeless tobacco: Never  Vaping Use   Vaping Use: Never used  Substance and Sexual Activity   Alcohol use: No   Drug use: No   Sexual activity: Yes  Other Topics Concern   Not on file  Social History Narrative   Not on file   Social Determinants of Health   Financial Resource Strain: Not on file  Food Insecurity: Not on file   Transportation Needs: Not on file  Physical Activity: Not on file  Stress: Not on file  Social Connections: Not on file  Intimate Partner Violence: Not on file    Review of Systems  Constitutional:  Negative for fatigue and unexpected weight change.  Eyes:  Negative for visual disturbance.  Respiratory:  Negative for cough, chest tightness and shortness of breath.   Cardiovascular:  Negative for chest pain, palpitations and leg swelling.  Gastrointestinal:  Negative for abdominal pain and blood in stool.  Neurological:  Negative for dizziness, light-headedness and headaches.     Objective:   Vitals:   07/02/22 1116  BP: 122/78  Pulse: 61  Temp: 97.9 F (36.6 C)  SpO2: 97%  Weight: 276 lb 9.6 oz (125.5 kg)  Height: 6' 2" (1.88 m)     Physical Exam Vitals reviewed.  Constitutional:      Appearance: He is well-developed.  HENT:     Head: Normocephalic and atraumatic.  Neck:     Vascular: No carotid bruit or JVD.  Cardiovascular:     Rate and Rhythm: Normal rate and regular rhythm.     Heart sounds: Normal heart sounds. No murmur heard. Pulmonary:     Effort: Pulmonary effort is normal.     Breath sounds: Normal breath sounds. No rales.  Musculoskeletal:     Right lower leg: No edema.     Left lower leg: No edema.  Skin:    General: Skin is warm and dry.  Neurological:     Mental Status: He is alert and oriented to person, place, and time.  Psychiatric:        Mood and Affect: Mood normal.        Assessment & Plan:  Andrew Reeves is a 56 y.o. male . Hyperlipidemia, unspecified hyperlipidemia type - Plan: Comprehensive metabolic panel, rosuvastatin (CRESTOR) 5 MG tablet  -Tolerating current dose of Crestor, continue same.  Check updated labs.  Uncontrolled type 2 diabetes mellitus with hyperglycemia (HCC) - Plan: dapagliflozin propanediol (FARXIGA) 10 MG TABS tablet, glimepiride (AMARYL) 2 MG tablet, glimepiride (AMARYL) 4 MG tablet, metFORMIN  (GLUCOPHAGE) 1000 MG tablet, rosuvastatin (CRESTOR) 5 MG tablet, Hemoglobin A1c  -Commended on diet/exercise changes, anticipate improved labs.  Results pending.  Adjust medications accordingly  Need for influenza vaccination - Plan: Flu Vaccine QUAD 6+ mos PF IM (Fluarix Quad PF)   Meds ordered this encounter  Medications   dapagliflozin propanediol (FARXIGA) 10 MG TABS tablet    Sig: Take 1 tablet (10 mg total) by mouth daily.    Dispense:  90 tablet    Refill:  1   glimepiride (AMARYL) 2 MG tablet  Sig: TAKE 1 TABLET BY MOUTH ONCE DAILY BEFORE BREAKFAST **ADD  TO  4  MG  FOR  TOTAL  DOSE  6  MG  PER  DAY**    Dispense:  90 tablet    Refill:  1   glimepiride (AMARYL) 4 MG tablet    Sig: Take 1 tablet (4 mg total) by mouth daily before breakfast.    Dispense:  90 tablet    Refill:  1   metFORMIN (GLUCOPHAGE) 1000 MG tablet    Sig: Take 1 tablet (1,000 mg total) by mouth 2 (two) times daily with a meal.    Dispense:  180 tablet    Refill:  1   rosuvastatin (CRESTOR) 5 MG tablet    Sig: Take 1 tablet (5 mg total) by mouth at bedtime.    Dispense:  90 tablet    Refill:  1   Patient Instructions  Depending on lab results we can discuss medication changes but none today.  Keep up the good work with exercise, watching diet, recheck in 3 months.  Take care.     Signed,   Merri Ray, MD [place] 07/02/22 11:44 AM

## 2022-08-07 ENCOUNTER — Ambulatory Visit (INDEPENDENT_AMBULATORY_CARE_PROVIDER_SITE_OTHER): Payer: Commercial Managed Care - PPO | Admitting: Family Medicine

## 2022-08-07 ENCOUNTER — Encounter: Payer: Self-pay | Admitting: Family Medicine

## 2022-08-07 VITALS — BP 122/80 | HR 68 | Temp 98.1°F | Ht 74.0 in | Wt 263.8 lb

## 2022-08-07 DIAGNOSIS — R52 Pain, unspecified: Secondary | ICD-10-CM | POA: Diagnosis not present

## 2022-08-07 DIAGNOSIS — R5383 Other fatigue: Secondary | ICD-10-CM

## 2022-08-07 DIAGNOSIS — R197 Diarrhea, unspecified: Secondary | ICD-10-CM | POA: Diagnosis not present

## 2022-08-07 DIAGNOSIS — R739 Hyperglycemia, unspecified: Secondary | ICD-10-CM

## 2022-08-07 DIAGNOSIS — R634 Abnormal weight loss: Secondary | ICD-10-CM | POA: Diagnosis not present

## 2022-08-07 DIAGNOSIS — E1165 Type 2 diabetes mellitus with hyperglycemia: Secondary | ICD-10-CM | POA: Diagnosis not present

## 2022-08-07 LAB — GLUCOSE, POCT (MANUAL RESULT ENTRY): POC Glucose: 105 mg/dl — AB (ref 70–99)

## 2022-08-07 LAB — CBC
HCT: 49.4 % (ref 39.0–52.0)
Hemoglobin: 16.1 g/dL (ref 13.0–17.0)
MCHC: 32.6 g/dL (ref 30.0–36.0)
MCV: 82.5 fl (ref 78.0–100.0)
Platelets: 168 10*3/uL (ref 150.0–400.0)
RBC: 5.99 Mil/uL — ABNORMAL HIGH (ref 4.22–5.81)
RDW: 14.1 % (ref 11.5–15.5)
WBC: 4.4 10*3/uL (ref 4.0–10.5)

## 2022-08-07 LAB — POC COVID19 BINAXNOW: SARS Coronavirus 2 Ag: NEGATIVE

## 2022-08-07 MED ORDER — DEXCOM G5 MOBILE TRANSMITTER MISC: 1.0000 | 3 refills | Status: AC

## 2022-08-07 MED ORDER — METFORMIN HCL 500 MG PO TABS: 500.0000 mg | ORAL_TABLET | Freq: Two times a day (BID) | ORAL | 1 refills | Status: DC

## 2022-08-07 NOTE — Progress Notes (Signed)
Subjective:  Patient ID: Andrew Reeves, male    DOB: 10-03-1966  Age: 56 y.o. MRN: [id number]  CC:  Chief Complaint  Patient presents with   Weight Loss    Pt states he has had weight loss, no no energy, and feeling weak. He states he had the booster shot on Sept 30  Pt also states that he has no appetite and thinks that the metformin has something to do with it, also wants to know about dexacom    Depression    PHQ9 - 10    HPI Andrew Reeves presents for   Fatigue As above, recent fatigue, decreased energy, reports generalized weakness, decreased appetite, weight loss. Past week-  10 pounds in past week. No change in vision, thirst or urination.  Moderna COVID booster received September 30th - some fatigue since.  Feels about the same past week.  No chest pain, no dyspnea. Some trouble sleeping - overheated at times. No fevers/chills. Waking up few times at night.  Eating soup, fruit, applesauce.  Some diarrhea recently. No blood. 3-4 times per day. No recent change in metformin dose. Has had some frequent BM in past, but not diarrhea as had in past week. No recent abx or hospitalizations.  No abd pain.  No cough. Some body aches.  Covid test 2 nights ago negative.  No sick contacts.  Glucose 116 this am. 333 a few nights ago. No symptomatic lows. Has been in 120-130 range usually,  He does have history of diabetes with last visit September 6th. Was taking Crestor, Farxiga and metformin at that time.  He was taking glimepiride 63m qd,  we increased that to 4 mg twice daily with hypoglycemic precautions. He has remained on 491mper day - 2 mg was discontinued. Some misunderstanding on lab instructions.       08/07/2022   11:56 AM 07/02/2022   11:12 AM 03/10/2022    2:39 PM 11/26/2021    9:15 AM 08/01/2021   10:37 AM  Depression screen PHQ 2/9  Decreased Interest 0 0 0 0 0  Down, Depressed, Hopeless 0 0 0 0 0  PHQ - 2 Score 0 0 0 0 0  Altered sleeping 2 0  0   Tired,  decreased energy 3 0  0   Change in appetite 3 0  0   Feeling bad or failure about yourself  0 0  0   Trouble concentrating 2 0  0   Moving slowly or fidgety/restless 0 0  0   Suicidal thoughts 0 0  0   PHQ-9 Score 10 0  0   Difficult doing work/chores    Not difficult at all          Lab Results  Component Value Date   HGBA1C 7.9 (H) 07/02/2022   Lab Results  Component Value Date   NA 137 07/02/2022   K 4.0 07/02/2022   CL 101 07/02/2022   CO2 26 07/02/2022      History Patient Active Problem List   Diagnosis Date Noted   Hypertriglyceridemia 01/01/2018   Di[place] ([facility]   Past Medical History:  Diagnosis Date   Diabetes mellitus without complication ([facility]   TYPE 2   Hyperlipidemia    Past Surgical History:  Procedure Laterality Date   HERNIA REPAIR     LIPOMA EXCISION Left 09/06/2020   Procedure: EXCISION OF BACK LIPOMA;  Surgeon: RaRalene OkMD;  [facility]: [facility];  Laterality: Left;   No Known Allergies Prior to Admission medications   Medication Sig Start Date End Date Taking? Authorizing Provider  blood glucose meter kit and supplies Dispense based on patient and insurance preference. Use up to four times daily as directed. (FOR ICD-10 E10.9, E11.9). 08/23/20  Yes Just, Laurita Quint, FNP  dapagliflozin propanediol (FARXIGA) 10 MG TABS tablet Take 1 tablet (10 mg total) by mouth daily. 07/02/22  Yes Wendie Agreste, MD  glimepiride (AMARYL) 4 MG tablet Take 1 tablet (4 mg total) by mouth daily before breakfast. 07/02/22  Yes Wendie Agreste, MD  Lancets [facility] DELICA PLUS [id number]) [place] USE UP TO FOUR TIMES DAILY AS DIRECTED 06/15/20  Yes Jacelyn Pi, Lilia Argue, MD  metFORMIN (GLUCOPHAGE) 1000 MG tablet Take 1 tablet (1,000 mg total) by mouth 2 (two) times daily with a meal. 07/02/22  Yes Wendie Agreste, MD  [facility] VERIO test strip USE UP TO FOUR TIMES DAILY AS NEEDED TO CHECK BLOOD SUGARS DAILY 06/13/22  Yes Wendie Agreste, MD  rosuvastatin (CRESTOR) 5 MG tablet Take 1 tablet (5 mg total) by mouth at bedtime. 07/02/22  Yes Wendie Agreste, MD  glimepiride (AMARYL) 2 MG tablet TAKE 1 TABLET BY MOUTH ONCE DAILY BEFORE BREAKFAST **ADD  TO  4  MG  FOR  TOTAL  DOSE  6  MG  PER  DAY** Patient not taking: Reported on 08/07/2022 07/02/22   Wendie Agreste, MD   Social History   Socioeconomic History   Marital status: Married    Spouse name: Not on file   Number of children: 4   Years of education: Not on file   Highest education level: Not on file  Occupational History   Not on file  Tobacco Use   Smoking status: Never   Smokeless tobacco: Never  Vaping Use   Vaping Use: Never used  Substance and Sexual Activity   Alcohol use: No   Drug use: No   Sexual activity: Yes  Other Topics Concern   Not on file  Social History Narrative   Not on file   Social Determinants of Health   Financial Resource Strain: Not on file  Food Insecurity: Not on file  Transportation Needs: Not on file  Physical Activity: Not on file  Stress: Not on file  Social Connections: Not on file  Intimate Partner Violence: Not on file    Review of Systems Per HPI.   Objective:   Vitals:   08/07/22 1159  BP: 122/80  Pulse: 68  Temp: 98.1 F (36.7 C)  SpO2: 100%  Weight: 263 lb 12.8 oz (119.7 kg)  Height: _0  (1.88 m)     Physical Exam Vitals reviewed.  Constitutional:      General: He is not in acute distress.    Appearance: He is well-developed. He is not ill-appearing, toxic-appearing or diaphoretic.  HENT:     Head: Normocephalic and atraumatic.  Neck:     Vascular: No carotid bruit or JVD.  Cardiovascular:     Rate and Rhythm: Normal rate and regular rhythm.     Heart sounds: Normal heart sounds. No murmur heard. Pulmonary:     Effort: Pulmonary effort is normal.     Breath sounds: Normal breath sounds. No rales.  Musculoskeletal:     Right lower leg: No edema.     Left lower leg: No  edema.  Skin:    General: Skin is warm and dry.  Neurological:  Mental Status: He is alert and oriented to person, place, and time.  Psychiatric:        Mood and Affect: Mood normal.     Results for orders placed or performed in visit on 08/07/22  POCT glucose (manual entry)  Result Value Ref Range   POC Glucose 105 (A) 70 - 99 mg/dl  POC COVID-19 BinaxNow  Result Value Ref Range   SARS Coronavirus 2 Ag Negative Negative   Wt Readings from Last 3 Encounters:  08/07/22 263 lb 12.8 oz (119.7 kg)  07/02/22 272 lb 9.6 oz (123.7 kg)  03/10/22 273 lb 12.8 oz (124.2 kg)    Assessment & Plan:  Andrew Reeves is a 56 y.o. male . Weight loss - Plan: CBC, TSH  Uncontrolled type 2 diabetes mellitus with hyperglycemia (HCC)  Fatigue, unspecified type - Plan: CBC, Comprehensive metabolic panel, POC [id number] BinaxNow  Diarrhea, unspecified type - Plan: POC COVID-19 BinaxNow  Body aches - Plan: POC COVID-19 BinaxNow  Hyperglycemia - Plan: POCT glucose (manual entry)  Fatigue, myalgias, weight loss with decreased appetite and diarrhea as above.  Some symptoms may be in response to the COVID booster, but question glycemic control, secondary viral illness, or side effects with meds.  Will decrease metformin to 500 mg twice daily for now given in office glucose but closely monitor home readings with RTC precautions if significant hyper or hypoglycemia.  Continue 4 mg glimepiride for now until appetite improves.  Check CBC, CMP, TSH and close monitoring of blood sugars with recheck in 1 week.  Work note provided, okay to return if feeling better on Monday, RTC precautions.  Meds ordered this encounter  Medications   Continuous Blood Gluc Transmit (DEXCOM G5 MOBILE TRANSMITTER) MISC    Sig: 1 Application by Does not apply route once a week. Up to 10 days.    Dispense:  4 each    Refill:  3   Patient Instructions  Blood sugar 105 today.  COVID test was negative.  As we discussed you  could have some side effects from the vaccine but also may be in part due to your medications or secondary virus infection.  Ok to remain out of work for the next few days, return on Monday if you are improving.  With the diarrhea I think it is reasonable to try a lower dose of metformin temporarily to see if that will also help with appetite and we will closely monitor weight.  I am checking some other blood work today and we will let you know if there are concerns.  Continue glimepiride same dose for now but check blood sugar 3 times per day and keep a record.  Fasting or 2 hours after meals.  If you are not feeling well, should check blood sugar at that time as well.  If any further 300s or anything below 100 I want to know. I think a continuous glucose meter would be helpful. I ordered the Dexcom, but not sure if that will be covered. If not, we can change to other sensor.   Return to the clinic or go to the nearest emergency room if any of your symptoms worsen or new symptoms occur.  Diarrhea, Adult Diarrhea is frequent loose and watery bowel movements. Diarrhea can make you feel weak and cause you to become dehydrated. Dehydration can make you tired and thirsty, cause you to have a dry mouth, and decrease how often you urinate. Diarrhea typically lasts 2-3 days. However, it can  last longer if it is a sign of something more serious. It is important to treat your diarrhea as told by your health care provider. Follow these instructions at home: Eating and drinking     Follow these recommendations as told by your health care provider: Take an oral rehydration solution (ORS). This is an over-the-counter medicine that helps return your body to its normal balance of nutrients and water. It is found at pharmacies and retail stores. Drink plenty of fluids, such as water, ice chips, diluted fruit juice, and low-calorie sports drinks. You can drink milk also, if desired. Avoid drinking fluids that contain  a lot of sugar or caffeine, such as energy drinks, sports drinks, and soda. Eat bland, easy-to-digest foods in small amounts as you are able. These foods include bananas, applesauce, rice, lean meats, toast, and crackers. Avoid alcohol. Avoid spicy or fatty foods.  Medicines Take over-the-counter and prescription medicines only as told by your health care provider. If you were prescribed an antibiotic medicine, take it as told by your health care provider. Do not stop using the antibiotic even if you start to feel better. General instructions  Wash your hands often using soap and water. If soap and water are not available, use a hand sanitizer. Others in the household should wash their hands as well. Hands should be washed: After using the toilet or changing a diaper. Before preparing, cooking, or serving food. While caring for a sick person or while visiting someone in a hospital. Drink enough fluid to keep your urine pale yellow. Rest at home while you recover. Watch your condition for any changes. Take a warm bath to relieve any burning or pain from frequent diarrhea episodes. Keep all follow-up visits as told by your health care provider. This is important. Contact a health care provider if: You have a fever. Your diarrhea gets worse. You have new symptoms. You cannot keep fluids down. You feel light-headed or dizzy. You have a headache. You have muscle cramps. Get help right away if: You have chest pain. You feel extremely weak or you faint. You have bloody or black stools or stools that look like tar. You have severe pain, cramping, or bloating in your abdomen. You have trouble breathing or you are breathing very quickly. Your heart is beating very quickly. Your skin feels cold and clammy. You feel confused. You have signs of dehydration, such as: Dark urine, very little urine, or no urine. Cracked lips. Dry mouth. Sunken eyes. Sleepiness. Weakness. Summary Diarrhea  is frequent loose and sometimes watery bowel movements. Diarrhea can make you feel weak and cause you to become dehydrated. Drink enough fluids to keep your urine pale yellow. Make sure that you wash your hands after using the toilet. If soap and water are not available, use hand sanitizer. Contact a health care provider if your diarrhea gets worse or you have new symptoms. Get help right away if you have signs of dehydration. This information is not intended to replace advice given to you by your health care provider. Make sure you discuss any questions you have with your health care provider. Document Revised: 01/03/2022 Document Reviewed: 04/24/2021 Elsevier Patient Education  [place].   Fatigue If you have fatigue, you feel tired all the time and have a lack of energy or a lack of motivation. Fatigue may make it difficult to start or complete tasks because of exhaustion. Occasional or mild fatigue is often a normal response to activity or life. However,  long-term (chronic) or extreme fatigue may be a symptom of a medical condition such as: Depression. Not having enough red blood cells or hemoglobin in the blood (anemia). A problem with a small gland located in the lower front part of the neck (thyroid disorder). Rheumatologic conditions. These are problems related to the body's defense system (immune system). Infections, especially certain viral infections. Fatigue can also lead to negative health outcomes over time. Follow these instructions at home: Medicines Take over-the-counter and prescription medicines only as told by your health care provider. Take a multivitamin if told by your health care provider. Do not use herbal or dietary supplements unless they are approved by your health care provider. Eating and drinking  Avoid heavy meals in the evening. Eat a well-balanced diet, which includes lean proteins, whole grains, plenty of fruits and vegetables, and low-fat dairy  products. Avoid eating or drinking too many products with caffeine in them. Avoid alcohol. Drink enough fluid to keep your urine pale yellow. Activity  Exercise regularly, as told by your health care provider. Use or practice techniques to help you relax, such as yoga, tai chi, meditation, or massage therapy. Lifestyle Change situations that cause you stress. Try to keep your work and personal schedules in balance. Do not use recreational or illegal drugs. General instructions Monitor your fatigue for any changes. Go to bed and get up at the same time every day. Avoid fatigue by pacing yourself during the day and getting enough sleep at night. Maintain a healthy weight. Contact a health care provider if: Your fatigue does not get better. You have a fever. You suddenly lose or gain weight. You have headaches. You have trouble falling asleep or sleeping through the night. You feel angry, guilty, anxious, or sad. You have swelling in your legs or another part of your body. Get help right away if: You feel confused, feel like you might faint, or faint. Your vision is blurry or you have a severe headache. You have severe pain in your abdomen, your back, or the area between your waist and hips (pelvis). You have chest pain, shortness of breath, or an irregular or fast heartbeat. You are unable to urinate, or you urinate less than normal. You have abnormal bleeding from the rectum, nose, lungs, nipples, or, if you are male, the vagina. You vomit blood. You have thoughts about hurting yourself or others. These symptoms may be an emergency. Get help right away. Call 911. Do not wait to see if the symptoms will go away. Do not drive yourself to the hospital. Get help right away if you feel like you may hurt yourself or others, or have thoughts about taking your own life. Go to your nearest emergency room or: Call 911. Call the [facility] at [phone] or  988. This is open 24 hours a day. Text the Crisis Text Line at 458-661-4194. Summary If you have fatigue, you feel tired all the time and have a lack of energy or a lack of motivation. Fatigue may make it difficult to start or complete tasks because of exhaustion. Long-term (chronic) or extreme fatigue may be a symptom of a medical condition. Exercise regularly, as told by your health care provider. Change situations that cause you stress. Try to keep your work and personal schedules in balance. This information is not intended to replace advice given to you by your health care provider. Make sure you discuss any questions you have with your health care provider. Document Revised: 08/05/2021  Document Reviewed: 08/05/2021 Elsevier Patient Education  [place],   Merri Ray, MD [place] 08/07/22 1:07 PM

## 2022-08-07 NOTE — Patient Instructions (Addendum)
Blood sugar 105 today.  COVID test was negative.  As we discussed you could have some side effects from the vaccine but also may be in part due to your medications or secondary virus infection.  Ok to remain out of work for the next few days, return on Monday if you are improving.  With the diarrhea I think it is reasonable to try a lower dose of metformin temporarily to see if that will also help with appetite and we will closely monitor weight.  I am checking some other blood work today and we will let you know if there are concerns.  Continue glimepiride same dose for now but check blood sugar 3 times per day and keep a record.  Fasting or 2 hours after meals.  If you are not feeling well, should check blood sugar at that time as well.  If any further 300s or anything below 100 I want to know. I think a continuous glucose meter would be helpful. I ordered the Dexcom, but not sure if that will be covered. If not, we can change to other sensor.   Return to the clinic or go to the nearest emergency room if any of your symptoms worsen or new symptoms occur.  Diarrhea, Adult Diarrhea is frequent loose and watery bowel movements. Diarrhea can make you feel weak and cause you to become dehydrated. Dehydration can make you tired and thirsty, cause you to have a dry mouth, and decrease how often you urinate. Diarrhea typically lasts 2-3 days. However, it can last longer if it is a sign of something more serious. It is important to treat your diarrhea as told by your health care provider. Follow these instructions at home: Eating and drinking     Follow these recommendations as told by your health care provider: Take an oral rehydration solution (ORS). This is an over-the-counter medicine that helps return your body to its normal balance of nutrients and water. It is found at pharmacies and retail stores. Drink plenty of fluids, such as water, ice chips, diluted fruit juice, and low-calorie sports drinks.  You can drink milk also, if desired. Avoid drinking fluids that contain a lot of sugar or caffeine, such as energy drinks, sports drinks, and soda. Eat bland, easy-to-digest foods in small amounts as you are able. These foods include bananas, applesauce, rice, lean meats, toast, and crackers. Avoid alcohol. Avoid spicy or fatty foods.  Medicines Take over-the-counter and prescription medicines only as told by your health care provider. If you were prescribed an antibiotic medicine, take it as told by your health care provider. Do not stop using the antibiotic even if you start to feel better. General instructions  Wash your hands often using soap and water. If soap and water are not available, use a hand sanitizer. Others in the household should wash their hands as well. Hands should be washed: After using the toilet or changing a diaper. Before preparing, cooking, or serving food. While caring for a sick person or while visiting someone in a hospital. Drink enough fluid to keep your urine pale yellow. Rest at home while you recover. Watch your condition for any changes. Take a warm bath to relieve any burning or pain from frequent diarrhea episodes. Keep all follow-up visits as told by your health care provider. This is important. Contact a health care provider if: You have a fever. Your diarrhea gets worse. You have new symptoms. You cannot keep fluids down. You feel light-headed or dizzy. You  have a headache. You have muscle cramps. Get help right away if: You have chest pain. You feel extremely weak or you faint. You have bloody or black stools or stools that look like tar. You have severe pain, cramping, or bloating in your abdomen. You have trouble breathing or you are breathing very quickly. Your heart is beating very quickly. Your skin feels cold and clammy. You feel confused. You have signs of dehydration, such as: Dark urine, very little urine, or no urine. Cracked  lips. Dry mouth. Sunken eyes. Sleepiness. Weakness. Summary Diarrhea is frequent loose and sometimes watery bowel movements. Diarrhea can make you feel weak and cause you to become dehydrated. Drink enough fluids to keep your urine pale yellow. Make sure that you wash your hands after using the toilet. If soap and water are not available, use hand sanitizer. Contact a health care provider if your diarrhea gets worse or you have new symptoms. Get help right away if you have signs of dehydration. This information is not intended to replace advice given to you by your health care provider. Make sure you discuss any questions you have with your health care provider. Document Revised: 01/03/2022 Document Reviewed: 04/24/2021 Elsevier Patient Education  [place].   Fatigue If you have fatigue, you feel tired all the time and have a lack of energy or a lack of motivation. Fatigue may make it difficult to start or complete tasks because of exhaustion. Occasional or mild fatigue is often a normal response to activity or life. However, long-term (chronic) or extreme fatigue may be a symptom of a medical condition such as: Depression. Not having enough red blood cells or hemoglobin in the blood (anemia). A problem with a small gland located in the lower front part of the neck (thyroid disorder). Rheumatologic conditions. These are problems related to the body's defense system (immune system). Infections, especially certain viral infections. Fatigue can also lead to negative health outcomes over time. Follow these instructions at home: Medicines Take over-the-counter and prescription medicines only as told by your health care provider. Take a multivitamin if told by your health care provider. Do not use herbal or dietary supplements unless they are approved by your health care provider. Eating and drinking  Avoid heavy meals in the evening. Eat a well-balanced diet, which includes lean  proteins, whole grains, plenty of fruits and vegetables, and low-fat dairy products. Avoid eating or drinking too many products with caffeine in them. Avoid alcohol. Drink enough fluid to keep your urine pale yellow. Activity  Exercise regularly, as told by your health care provider. Use or practice techniques to help you relax, such as yoga, tai chi, meditation, or massage therapy. Lifestyle Change situations that cause you stress. Try to keep your work and personal schedules in balance. Do not use recreational or illegal drugs. General instructions Monitor your fatigue for any changes. Go to bed and get up at the same time every day. Avoid fatigue by pacing yourself during the day and getting enough sleep at night. Maintain a healthy weight. Contact a health care provider if: Your fatigue does not get better. You have a fever. You suddenly lose or gain weight. You have headaches. You have trouble falling asleep or sleeping through the night. You feel angry, guilty, anxious, or sad. You have swelling in your legs or another part of your body. Get help right away if: You feel confused, feel like you might faint, or faint. Your vision is blurry or you have  a severe headache. You have severe pain in your abdomen, your back, or the area between your waist and hips (pelvis). You have chest pain, shortness of breath, or an irregular or fast heartbeat. You are unable to urinate, or you urinate less than normal. You have abnormal bleeding from the rectum, nose, lungs, nipples, or, if you are male, the vagina. You vomit blood. You have thoughts about hurting yourself or others. These symptoms may be an emergency. Get help right away. Call 911. Do not wait to see if the symptoms will go away. Do not drive yourself to the hospital. Get help right away if you feel like you may hurt yourself or others, or have thoughts about taking your own life. Go to your nearest emergency room or: Call  911. Call the [place] at [phone] or 988. This is open 24 hours a day. Text the Crisis Text Line at 786-148-8836. Summary If you have fatigue, you feel tired all the time and have a lack of energy or a lack of motivation. Fatigue may make it difficult to start or complete tasks because of exhaustion. Long-term (chronic) or extreme fatigue may be a symptom of a medical condition. Exercise regularly, as told by your health care provider. Change situations that cause you stress. Try to keep your work and personal schedules in balance. This information is not intended to replace advice given to you by your health care provider. Make sure you discuss any questions you have with your health care provider. Document Revised: 08/05/2021 Document Reviewed: 08/05/2021 Elsevier Patient Education  [place].

## 2022-08-08 LAB — COMPREHENSIVE METABOLIC PANEL
ALT: 37 U/L (ref 0–53)
AST: 33 U/L (ref 0–37)
Albumin: 4.2 g/dL (ref 3.5–5.2)
Alkaline Phosphatase: 54 U/L (ref 39–117)
BUN: 11 mg/dL (ref 6–23)
CO2: 28 mEq/L (ref 19–32)
Calcium: 8.8 mg/dL (ref 8.4–10.5)
Chloride: 103 mEq/L (ref 96–112)
Creatinine, Ser: 1.11 mg/dL (ref 0.40–1.50)
GFR: 73.06 mL/min (ref >60.00)
Glucose, Bld: 70 mg/dL (ref 70–99)
Potassium: 4.6 mEq/L (ref 3.5–5.1)
Sodium: 138 mEq/L (ref 135–145)
Total Bilirubin: 0.7 mg/dL (ref 0.2–1.2)
Total Protein: 7.4 g/dL (ref 6.0–8.3)

## 2022-08-08 LAB — TSH: TSH: 0.98 u[IU]/mL (ref 0.35–5.50)

## 2022-08-15 ENCOUNTER — Ambulatory Visit: Payer: Commercial Managed Care - PPO | Admitting: Family Medicine

## 2022-08-18 ENCOUNTER — Encounter: Payer: Commercial Managed Care - PPO | Admitting: Family Medicine

## 2022-08-29 NOTE — Addendum Note (Signed)
Addended by: Trixie Deis on: 08/29/2022 10:38 AM   Modules accepted: Orders

## 2022-09-01 ENCOUNTER — Other Ambulatory Visit: Payer: Self-pay | Admitting: Lab

## 2022-09-01 ENCOUNTER — Other Ambulatory Visit: Payer: Self-pay | Admitting: Family Medicine

## 2022-09-01 DIAGNOSIS — E1165 Type 2 diabetes mellitus with hyperglycemia: Secondary | ICD-10-CM

## 2022-09-01 MED ORDER — ONETOUCH VERIO VI STRP: ORAL_STRIP | 0 refills | Status: DC

## 2022-09-16 ENCOUNTER — Other Ambulatory Visit: Payer: Self-pay | Admitting: Lab

## 2022-09-16 DIAGNOSIS — E1165 Type 2 diabetes mellitus with hyperglycemia: Secondary | ICD-10-CM

## 2022-09-16 MED ORDER — ONETOUCH DELICA PLUS LANCET33G MISC: 11 refills | Status: DC

## 2022-10-10 ENCOUNTER — Ambulatory Visit (INDEPENDENT_AMBULATORY_CARE_PROVIDER_SITE_OTHER): Payer: Commercial Managed Care - PPO | Admitting: Family Medicine

## 2022-10-10 ENCOUNTER — Encounter: Payer: Self-pay | Admitting: Family Medicine

## 2022-10-10 DIAGNOSIS — R5383 Other fatigue: Secondary | ICD-10-CM

## 2022-10-10 DIAGNOSIS — E785 Hyperlipidemia, unspecified: Secondary | ICD-10-CM

## 2022-10-10 DIAGNOSIS — R197 Diarrhea, unspecified: Secondary | ICD-10-CM | POA: Diagnosis not present

## 2022-10-10 DIAGNOSIS — E1165 Type 2 diabetes mellitus with hyperglycemia: Secondary | ICD-10-CM

## 2022-10-10 LAB — BASIC METABOLIC PANEL
BUN: 16 mg/dL (ref 6–23)
CO2: 28 mEq/L (ref 19–32)
Calcium: 9.1 mg/dL (ref 8.4–10.5)
Chloride: 102 mEq/L (ref 96–112)
Creatinine, Ser: 0.99 mg/dL (ref 0.40–1.50)
GFR: 83.71 mL/min (ref >60.00)
Glucose, Bld: 114 mg/dL — ABNORMAL HIGH (ref 70–99)
Potassium: 4.2 mEq/L (ref 3.5–5.1)
Sodium: 138 mEq/L (ref 135–145)

## 2022-10-10 LAB — HEMOGLOBIN A1C: Hgb A1c MFr Bld: 7.8 % — ABNORMAL HIGH (ref 4.6–6.5)

## 2022-10-10 MED ORDER — METFORMIN HCL 500 MG PO TABS: 500.0000 mg | ORAL_TABLET | Freq: Two times a day (BID) | ORAL | 1 refills | Status: DC

## 2022-10-10 MED ORDER — GLIMEPIRIDE 4 MG PO TABS: 4.0000 mg | ORAL_TABLET | Freq: Every day | ORAL | 1 refills | Status: DC

## 2022-10-10 MED ORDER — ROSUVASTATIN CALCIUM 5 MG PO TABS: 5.0000 mg | ORAL_TABLET | Freq: Every day | ORAL | 1 refills | Status: DC

## 2022-10-10 MED ORDER — DAPAGLIFLOZIN PROPANEDIOL 10 MG PO TABS: 10.0000 mg | ORAL_TABLET | Freq: Every day | ORAL | 1 refills | Status: DC

## 2022-10-10 NOTE — Patient Instructions (Signed)
Okay to continue same dose of meds for now.  If A1c is elevated I would recommend trying twice per day metformin, that is how I wrote the prescription today.  If it is doing well, then can remain on once per day.  Recheck in 3 months but let me know if there are questions.  Take care.

## 2022-10-10 NOTE — Progress Notes (Signed)
Subjective:  Patient ID: Andrew Reeves, male    DOB: 03-07-1966  Age: 56 y.o. MRN: [id number]  CC:  Chief Complaint  Patient presents with   Diabetes    Pt states all is well    HPI Andrew Reeves presents for   Diabetes: Complicated by hyperglycemia, previously treated by nutritionist meds include Farxiga, metformin, glimepiride 9m qd.  Option of lower dose of metformin temporarily at previous visit due to fatigue, diarrhea. Feeling better since last visit. Less diarrhea on lower dose - 5066m- only taking once per day.  Has been treated with Crestor for statin.  On vitamin D supplementation with normal reading of 30 in September.  Previously low in 18 range. Hemorrhoids fasting:120-140 Home readings postprandial:110-120 No symptomatic lows. Lowest in 90's.  Microalbumin: 2.5 on 03/10/2022. Optho, foot exam, pneumovax: UTD.   Lab Results  Component Value Date   HGBA1C 7.9 (H) 07/02/2022   HGBA1C 7.5 (H) 03/10/2022   HGBA1C 8.3 (H) 11/20/2021   Lab Results  Component Value Date   MICROALBUR 2.5 (H) 03/10/2022   LDLCALC 75 11/21/2021   CREATININE 1.11 08/07/2022   Wt Readings from Last 3 Encounters:  10/10/22 274 lb 6.4 oz (124.5 kg)  08/07/22 263 lb 12.8 oz (119.7 kg)  07/02/22 272 lb 9.6 oz (123.7 kg)   [facility]   Diagnosis Date Noted   Hypertriglyceridemia 01/01/2018   [facility]   Past Medical History:  Diagnosis Date   Di[place] mellitus without complication ([facility]   TYPE 2   Hyperlipidemia    Past Surgical History:  Procedure Laterality Date   HERNIA REPAIR     LIPOMA EXCISION Left 09/06/2020   Procedure: EXCISION OF BACK LIPOMA;  Surgeon: RaRalene OkMD;  [place] [facility] [place] [facility];  Laterality: Left;   No Known Allergies Prior to Admission medications   Medication Sig Start Date End Date Taking? Authorizing Provider  blood glucose meter kit and  supplies Dispense based on patient and insurance preference. Use up to four times daily as directed. (FOR ICD-10 E10.9, E11.9). 08/23/20  Yes Just, KeLaurita QuintFNP  dapagliflozin propanediol (FARXIGA) 10 MG TABS tablet Take 1 tablet (10 mg total) by mouth daily. 07/02/22  Yes GrWendie AgresteMD  glimepiride (AMARYL) 4 MG tablet Take 1 tablet (4 mg total) by mouth daily before breakfast. 07/02/22  Yes GrWendie AgresteMD  glucose blood (ONETOUCH VERIO) test strip USE UP TO FOUR TIMES DAILY AS NEEDED TO CHECK BLOOD SUGARS DAILY 09/01/22  Yes GrWendie AgresteMD  Lancets (OF[facility] HospitalELICA PLUS LAUMPNTI14EMISC USE UP TO FOUR TIMES DAILY AS DIRECTED 09/16/22  Yes GrWendie AgresteMD  metFORMIN (GLUCOPHAGE) 500 MG tablet Take 1 tablet (500 mg total) by mouth 2 (two) times daily with a meal. 08/07/22  Yes GrWendie AgresteMD  ON[facility] LLCERIO test strip USE UP TO 4 TIMES DAILY AS NEEDED TO CHECK BLOOD SUGAR DAILY 09/01/22  Yes GrWendie AgresteMD  rosuvastatin (CRESTOR) 5 MG tablet Take 1 tablet (5 mg total) by mouth at bedtime. 07/02/22  Yes GrWendie AgresteMD  Continuous Blood Gluc Transmit (DEXCOM G5 MOBILE TRANSMITTER) MISC 1 Application by Does not apply route once a week. Up to 10 days. Patient not taking: Reported on 10/10/2022 08/07/22   GrWendie AgresteMD   Social History   Socioeconomic History   Marital  status: Married    Spouse name: Not on file   Number of children: 4   Years of education: Not on file   Highest education level: Not on file  Occupational History   Not on file  Tobacco Use   Smoking status: Never   Smokeless tobacco: Never  Vaping Use   Vaping Use: Never used  Substance and Sexual Activity   Alcohol use: No   Drug use: No   Sexual activity: Yes  Other Topics Concern   Not on file  Social History Narrative   Not on file   Social Determinants of Health   Financial Resource Strain: Not on file  Food Insecurity: Not on file  Transportation Needs: Not on  file  Physical Activity: Not on file  Stress: Not on file  Social Connections: Not on file  Intimate Partner Violence: Not on file    Review of Systems  Constitutional:  Negative for fatigue and unexpected weight change.  Eyes:  Negative for visual disturbance.  Respiratory:  Negative for cough, chest tightness and shortness of breath.   Cardiovascular:  Negative for chest pain, palpitations and leg swelling.  Gastrointestinal:  Negative for abdominal pain, blood in stool, diarrhea, nausea and vomiting.  Neurological:  Negative for dizziness, light-headedness and headaches.     Objective:   Vitals:   10/10/22 0836  BP: 120/78  Pulse: 64  Temp: 98.3 F (36.8 C)  SpO2: 98%  Weight: 274 lb 6.4 oz (124.5 kg)  Height: 6' 2" (1.88 m)     Physical Exam Vitals reviewed.  Constitutional:      Appearance: He is well-developed.  HENT:     Head: Normocephalic and atraumatic.  Neck:     Vascular: No carotid bruit or JVD.  Cardiovascular:     Rate and Rhythm: Normal rate and regular rhythm.     Heart sounds: Normal heart sounds. No murmur heard. Pulmonary:     Effort: Pulmonary effort is normal.     Breath sounds: Normal breath sounds. No rales.  Musculoskeletal:     Right lower leg: No edema.     Left lower leg: No edema.  Skin:    General: Skin is warm and dry.  Neurological:     Mental Status: He is alert and oriented to person, place, and time.  Psychiatric:        Mood and Affect: Mood normal.        Assessment & Plan:  Andrew Reeves is a 56 y.o. male . Uncontrolled type 2 diabetes mellitus with hyperglycemia (HCC) - Plan: dapagliflozin propanediol (FARXIGA) 10 MG TABS tablet, glimepiride (AMARYL) 4 MG tablet, metFORMIN (GLUCOPHAGE) 500 MG tablet, rosuvastatin (CRESTOR) 5 MG tablet, Basic metabolic panel, Hemoglobin A1c  Fatigue, unspecified type - Plan: metFORMIN (GLUCOPHAGE) 500 MG tablet  Diarrhea, unspecified type - Plan: metFORMIN (GLUCOPHAGE) 500 MG  tablet  Hyperlipidemia, unspecified hyperlipidemia type - Plan: rosuvastatin (CRESTOR) 5 MG tablet  Overall stable glucose readings at home on current regimen including metformin once per day.  Fatigue, diarrhea, previous symptoms have improved on lower dose metformin, and weight is improved as above.  Will continue same regimen for now but anticipate he may be able to tolerate twice daily dosing of 500 mg metformin.  Will wait on A1c results first.  Meds refilled, continue same dose Farxiga, glimepiride for now.  71-monthfollow-up.  Meds ordered this encounter  Medications   dapagliflozin propanediol (FARXIGA) 10 MG TABS tablet    Sig: Take  1 tablet (10 mg total) by mouth daily.    Dispense:  90 tablet    Refill:  1   glimepiride (AMARYL) 4 MG tablet    Sig: Take 1 tablet (4 mg total) by mouth daily before breakfast.    Dispense:  90 tablet    Refill:  1   metFORMIN (GLUCOPHAGE) 500 MG tablet    Sig: Take 1 tablet (500 mg total) by mouth 2 (two) times daily with a meal.    Dispense:  60 tablet    Refill:  1   rosuvastatin (CRESTOR) 5 MG tablet    Sig: Take 1 tablet (5 mg total) by mouth at bedtime.    Dispense:  90 tablet    Refill:  1   Patient Instructions  Okay to continue same dose of meds for now.  If A1c is elevated I would recommend trying twice per day metformin, that is how I wrote the prescription today.  If it is doing well, then can remain on once per day.  Recheck in 3 months but let me know if there are questions.  Take care.     Signed,   Merri Ray, MD [place] 10/10/22 8:55 AM

## 2022-11-27 NOTE — Progress Notes (Signed)
This encounter was created in error - please disregard.

## 2022-12-12 ENCOUNTER — Other Ambulatory Visit: Payer: Self-pay | Admitting: Family Medicine

## 2022-12-12 DIAGNOSIS — R197 Diarrhea, unspecified: Secondary | ICD-10-CM

## 2022-12-12 DIAGNOSIS — R5383 Other fatigue: Secondary | ICD-10-CM

## 2022-12-12 DIAGNOSIS — E1165 Type 2 diabetes mellitus with hyperglycemia: Secondary | ICD-10-CM

## 2022-12-22 ENCOUNTER — Encounter: Payer: Self-pay | Admitting: Family Medicine

## 2022-12-23 ENCOUNTER — Encounter: Payer: Self-pay | Admitting: Family Medicine

## 2023-01-02 ENCOUNTER — Ambulatory Visit: Payer: Commercial Managed Care - PPO | Admitting: Family Medicine

## 2023-01-16 ENCOUNTER — Ambulatory Visit: Payer: Commercial Managed Care - PPO | Admitting: Family Medicine

## 2023-01-29 ENCOUNTER — Ambulatory Visit: Payer: Self-pay | Admitting: Family Medicine

## 2023-02-20 ENCOUNTER — Encounter: Payer: Self-pay | Admitting: Family Medicine

## 2023-02-20 ENCOUNTER — Ambulatory Visit (INDEPENDENT_AMBULATORY_CARE_PROVIDER_SITE_OTHER): Payer: Commercial Managed Care - PPO | Admitting: Family Medicine

## 2023-02-20 VITALS — BP 120/78 | HR 74 | Temp 98.8°F | Ht 74.0 in | Wt 275.0 lb

## 2023-02-20 DIAGNOSIS — R197 Diarrhea, unspecified: Secondary | ICD-10-CM

## 2023-02-20 DIAGNOSIS — R5383 Other fatigue: Secondary | ICD-10-CM | POA: Diagnosis not present

## 2023-02-20 DIAGNOSIS — E1165 Type 2 diabetes mellitus with hyperglycemia: Secondary | ICD-10-CM

## 2023-02-20 DIAGNOSIS — E785 Hyperlipidemia, unspecified: Secondary | ICD-10-CM | POA: Diagnosis not present

## 2023-02-20 DIAGNOSIS — E119 Type 2 diabetes mellitus without complications: Secondary | ICD-10-CM

## 2023-02-20 DIAGNOSIS — E781 Pure hyperglyceridemia: Secondary | ICD-10-CM

## 2023-02-20 LAB — COMPREHENSIVE METABOLIC PANEL
ALT: 34 U/L (ref 0–53)
AST: 31 U/L (ref 0–37)
Albumin: 4.2 g/dL (ref 3.5–5.2)
Alkaline Phosphatase: 64 U/L (ref 39–117)
BUN: 16 mg/dL (ref 6–23)
CO2: 28 mEq/L (ref 19–32)
Calcium: 9 mg/dL (ref 8.4–10.5)
Chloride: 101 mEq/L (ref 96–112)
Creatinine, Ser: 1 mg/dL (ref 0.40–1.50)
GFR: 82.5 mL/min (ref >60.00)
Glucose, Bld: 76 mg/dL (ref 70–99)
Potassium: 4.4 mEq/L (ref 3.5–5.1)
Sodium: 138 mEq/L (ref 135–145)
Total Bilirubin: 0.8 mg/dL (ref 0.2–1.2)
Total Protein: 7.3 g/dL (ref 6.0–8.3)

## 2023-02-20 LAB — HEMOGLOBIN A1C: Hgb A1c MFr Bld: 8.3 % — ABNORMAL HIGH (ref 4.6–6.5)

## 2023-02-20 MED ORDER — METFORMIN HCL 500 MG PO TABS: 500.0000 mg | ORAL_TABLET | Freq: Two times a day (BID) | ORAL | 1 refills | Status: DC

## 2023-02-20 MED ORDER — DAPAGLIFLOZIN PROPANEDIOL 10 MG PO TABS: 10.0000 mg | ORAL_TABLET | Freq: Every day | ORAL | 1 refills | Status: DC

## 2023-02-20 MED ORDER — GLIMEPIRIDE 4 MG PO TABS: 4.0000 mg | ORAL_TABLET | Freq: Every day | ORAL | 1 refills | Status: DC

## 2023-02-20 MED ORDER — ROSUVASTATIN CALCIUM 5 MG PO TABS: 5.0000 mg | ORAL_TABLET | Freq: Every day | ORAL | 1 refills | Status: DC

## 2023-02-20 NOTE — Assessment & Plan Note (Signed)
Tolerating current dosages of Farxiga, glimepiride, and higher dose of metformin.  Uncontrolled by last A1c, and recent readings slightly elevated.  Likely will need to adjust meds further, possibly dosage of metformin, or can reevaluate med regimen for additional medications or changes once A1c, labs return.  Diet discussed, and continue to stay active/exercise.  45-month follow-up.

## 2023-02-20 NOTE — Progress Notes (Signed)
Subjective:  Patient ID: Andrew Reeves, male    DOB: [birth year]  Age: 57 y.o. MRN: [id number]  CC:  Chief Complaint  Patient presents with   Diabetes    No concerns     HPI Andrew Reeves presents for   Diabetes: Complicated by hyperglycemia.  Treated with Farxiga, metformin, glimepiride. Lower doses of metformin due to diarrhea, 500 mg 4 times daily at his last visit.  On statin with Crestor.  A1c elevated in December. Increased metformin to BID since then. No diarrhea.  Home readings fasting: 120-130 Postprandial: 150's No symptomatic lows. Exercise - daily- active work, walking.  Diet soda only, occasional tea - not regular.  Microalbumin: 2.5 on 03/10/2022. Optho, foot exam, pneumovax: up to date.   Lab Results  Component Value Date   HGBA1C 7.8 (H) 10/10/2022   HGBA1C 7.9 (H) 07/02/2022   HGBA1C 7.5 (H) 03/10/2022   Lab Results  Component Value Date   MICROALBUR 2.5 (H) 03/10/2022   LDLCALC 75 11/21/2021   CREATININE 0.99 10/10/2022    History Patient Active Problem List   Diagnosis Date Noted   Hypertriglyceridemia 01/01/2018   Diabetes (HCC) 03/29/2017    Past Medical History:  Diagnosis Date   Diabetes mellitus without complication (HCC)    TYPE 2   Hyperlipidemia       Review of Systems  Constitutional:  Negative for fatigue and unexpected weight change.  Eyes:  Negative for visual disturbance.  Respiratory:  Negative for cough, chest tightness and shortness of breath.   Cardiovascular:  Negative for chest pain, palpitations and leg swelling.  Gastrointestinal:  Negative for abdominal pain and blood in stool.  Neurological:  Negative for dizziness, light-headedness and headaches.     Objective:   Vitals:   02/20/23 0955  BP: 120/78  Pulse: 74  Temp: 98.8 F (37.1 C)  TempSrc: Temporal  SpO2: 97%  Weight: 275 lb (124.7 kg)  Height: 6\' 2"  (1.88 m)     Physical Exam Vitals reviewed.  Constitutional:      Appearance: He is  well-developed.  HENT:     Head: Normocephalic and atraumatic.  Neck:     Vascular: No carotid bruit or JVD.  Cardiovascular:     Rate and Rhythm: Normal rate and regular rhythm.     Heart sounds: Normal heart sounds. No murmur heard. Pulmonary:     Effort: Pulmonary effort is normal.     Breath sounds: Normal breath sounds. No rales.  Musculoskeletal:     Right lower leg: No edema.     Left lower leg: No edema.  Skin:    General: Skin is warm and dry.  Neurological:     Mental Status: He is alert and oriented to person, place, and time.  Psychiatric:        Mood and Affect: Mood normal.        Assessment & Plan:  Andrew Reeves is a 57 y.o. male . Type 2 diabetes mellitus without complication, unspecified whether long term insulin use (HCC) Assessment & Plan: Tolerating current dosages of Farxiga, glimepiride, and higher dose of metformin.  Uncontrolled by last A1c, and recent readings slightly elevated.  Likely will need to adjust meds further, possibly dosage of metformin, or can reevaluate med regimen for additional medications or changes once A1c, labs return.  Diet discussed, and continue to stay active/exercise.  53-month follow-up.  Orders: -     Hemoglobin A1c -     Comprehensive metabolic  panel  Uncontrolled type 2 diabetes mellitus with hyperglycemia (HCC) -     Dapagliflozin Propanediol; Take 1 tablet (10 mg total) by mouth daily.  Dispense: 90 tablet; Refill: 1 -     Glimepiride; Take 1 tablet (4 mg total) by mouth daily before breakfast.  Dispense: 90 tablet; Refill: 1 -     metFORMIN HCl; Take 1 tablet (500 mg total) by mouth 2 (two) times daily with a meal.  Dispense: 180 tablet; Refill: 1 -     Rosuvastatin Calcium; Take 1 tablet (5 mg total) by mouth at bedtime.  Dispense: 90 tablet; Refill: 1  Fatigue, unspecified type -     metFORMIN HCl; Take 1 tablet (500 mg total) by mouth 2 (two) times daily with a meal.  Dispense: 180 tablet; Refill: 1  Diarrhea,  unspecified type -     metFORMIN HCl; Take 1 tablet (500 mg total) by mouth 2 (two) times daily with a meal.  Dispense: 180 tablet; Refill: 1  Hyperlipidemia, unspecified hyperlipidemia type -     Rosuvastatin Calcium; Take 1 tablet (5 mg total) by mouth at bedtime.  Dispense: 90 tablet; Refill: 1  Hypertriglyceridemia Assessment & Plan: Tolerating statin, continue same dose.  Fasting labs next visit.  Recent LDL looked okay.     Patient Instructions  Thanks for coming in today.  I will check your labs, and can decide if additional medication or higher doses of medicines are needed.  No changes for now.  Continue to watch diet, careful with portions and carbohydrates, try to avoid sugar-containing beverages as much as possible.  Take care!    Signed,   Meredith Staggers, MD [facility], [facility] 02/20/23 10:44 AM

## 2023-02-20 NOTE — Patient Instructions (Signed)
Thanks for coming in today.  I will check your labs, and can decide if additional medication or higher doses of medicines are needed.  No changes for now.  Continue to watch diet, careful with portions and carbohydrates, try to avoid sugar-containing beverages as much as possible.  Take care!

## 2023-02-20 NOTE — Assessment & Plan Note (Signed)
Tolerating statin, continue same dose.  Fasting labs next visit.  Recent LDL looked okay.

## 2023-03-06 ENCOUNTER — Telehealth: Payer: Self-pay

## 2023-03-06 NOTE — Telephone Encounter (Signed)
Left pt a VM asking him to give [place] a call in regards to his eye exam

## 2023-04-17 ENCOUNTER — Other Ambulatory Visit: Payer: Self-pay | Admitting: Family Medicine

## 2023-04-17 DIAGNOSIS — E1165 Type 2 diabetes mellitus with hyperglycemia: Secondary | ICD-10-CM

## 2023-05-22 ENCOUNTER — Ambulatory Visit: Payer: Commercial Managed Care - PPO | Admitting: Family Medicine

## 2023-07-13 ENCOUNTER — Other Ambulatory Visit: Payer: Self-pay | Admitting: Family Medicine

## 2023-07-13 DIAGNOSIS — E1165 Type 2 diabetes mellitus with hyperglycemia: Secondary | ICD-10-CM

## 2023-07-20 ENCOUNTER — Ambulatory Visit (INDEPENDENT_AMBULATORY_CARE_PROVIDER_SITE_OTHER): Payer: Commercial Managed Care - PPO | Admitting: Family Medicine

## 2023-07-20 ENCOUNTER — Encounter: Payer: Self-pay | Admitting: Family Medicine

## 2023-07-20 VITALS — BP 124/88 | HR 70 | Temp 98.3°F | Wt 273.4 lb

## 2023-07-20 DIAGNOSIS — E1165 Type 2 diabetes mellitus with hyperglycemia: Secondary | ICD-10-CM

## 2023-07-20 DIAGNOSIS — M791 Myalgia, unspecified site: Secondary | ICD-10-CM

## 2023-07-20 DIAGNOSIS — R051 Acute cough: Secondary | ICD-10-CM | POA: Diagnosis not present

## 2023-07-20 DIAGNOSIS — U071 COVID-19: Secondary | ICD-10-CM

## 2023-07-20 DIAGNOSIS — E785 Hyperlipidemia, unspecified: Secondary | ICD-10-CM

## 2023-07-20 DIAGNOSIS — Z7984 Long term (current) use of oral hypoglycemic drugs: Secondary | ICD-10-CM

## 2023-07-20 DIAGNOSIS — R5383 Other fatigue: Secondary | ICD-10-CM

## 2023-07-20 DIAGNOSIS — R197 Diarrhea, unspecified: Secondary | ICD-10-CM

## 2023-07-20 LAB — POC INFLUENZA A&B (BINAX/QUICKVUE)
Influenza A, POC: NEGATIVE
Influenza B, POC: NEGATIVE

## 2023-07-20 LAB — POC COVID19 BINAXNOW: SARS Coronavirus 2 Ag: POSITIVE — AB

## 2023-07-20 MED ORDER — NIRMATRELVIR/RITONAVIR (PAXLOVID)TABLET
3.0000 | ORAL_TABLET | Freq: Two times a day (BID) | ORAL | 0 refills | Status: AC
Start: 2023-07-20 — End: 2023-07-25

## 2023-07-20 MED ORDER — DAPAGLIFLOZIN PROPANEDIOL 10 MG PO TABS
10.0000 mg | ORAL_TABLET | Freq: Every day | ORAL | 1 refills | Status: AC
Start: 2023-07-20 — End: ?

## 2023-07-20 MED ORDER — GLIMEPIRIDE 4 MG PO TABS
4.0000 mg | ORAL_TABLET | Freq: Every day | ORAL | 1 refills | Status: DC
Start: 2023-07-20 — End: 2023-10-22

## 2023-07-20 MED ORDER — BENZONATATE 100 MG PO CAPS
100.0000 mg | ORAL_CAPSULE | Freq: Three times a day (TID) | ORAL | 0 refills | Status: DC | PRN
Start: 2023-07-20 — End: 2023-09-18

## 2023-07-20 MED ORDER — METFORMIN HCL 500 MG PO TABS
500.0000 mg | ORAL_TABLET | Freq: Two times a day (BID) | ORAL | 1 refills | Status: DC
Start: 2023-07-20 — End: 2024-02-22

## 2023-07-20 MED ORDER — ROSUVASTATIN CALCIUM 5 MG PO TABS
5.0000 mg | ORAL_TABLET | Freq: Every day | ORAL | 1 refills | Status: AC
Start: 2023-07-20 — End: ?

## 2023-07-20 NOTE — Patient Instructions (Addendum)
I will refill chronic meds for now - recheck within 1 month for chronic meds, labs.   Unfortunately you do have COVID-19 infection.  I expect you to do okay with this and continue to treat outpatient.  You can take the antiviral Paxlovid, start first dose today.  Do not take the rosuvastatin for 1 week as that can interact with the Paxlovid.  Mucinex over-the-counter is fine for cough, drink plenty of fluids and rest.  Tessalon Perles can also be used for cough, I sent this to your pharmacy.  Tylenol or Motrin as needed for body aches, prefer Tylenol.  If any chest pains, shortness of breath at rest, confusion or acute worsening be seen.  I do not expect this to happen.  Hang in there and hope you feel better soon.   Return to the clinic or go to the nearest emergency room if any of your symptoms worsen or new symptoms occur.

## 2023-07-20 NOTE — Progress Notes (Unsigned)
Subjective:  Patient ID: Andrew Reeves, male    DOB: [birth year]  Age: 57 y.o. MRN: [id number]  CC:  Chief Complaint  Patient presents with   Cough    HPI Andrew Reeves presents for   Sore throat/cough Started with cough 4 days ago, slight less cough, sore in ribs to cough. Minimally productive. Still some sore throat. Drinking fluids ok, no confusion.  Wife had cough early last week. Mild and resolved. No other sick contacts, no testing at home.  No dyspnea at rest - slight with activity. No chest pain - rib pain with cough.  Out of work Friday and today. 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   Tx: otc cold and cough med.   Most recent COVID booster: last year.   eGFR 82.5 on 02/20/23.  Last Crestor yesterday.   Not fasting today. Due for chronic med follow up.   Immunization History  Administered Date(s) Administered   Influenza,inj,Quad PF,6+ Mos 09/05/2019   Influenza-Unspecified 08/09/2020, 07/26/2022   PFIZER(Purple Top)SARS-COV-2 Vaccination 01/01/2020, 02/01/2020, 08/09/2020   PPD Test 12/21/2018   Pneumococcal Polysaccharide-23 08/31/2020   Td (Adult), 2 Lf Tetanus Toxid, Preservative Free 10/27/2006   Tdap 10/18/2018   Unspecified SARS-COV-2 Vaccination 07/26/2022   Zoster Recombinant(Shingrix) 07/20/2020, 03/01/2021    History Patient Active Problem List   Diagnosis Date Noted   Hypertriglyceridemia 01/01/2018   Diabetes (HCC) 03/29/2017   Past Medical History:  Diagnosis Date   Diabetes mellitus without complication (HCC)    TYPE 2   Hyperlipidemia    Past Surgical History:  Procedure Laterality Date   HERNIA REPAIR     LIPOMA EXCISION Left 09/06/2020   Procedure: EXCISION OF BACK LIPOMA;  Surgeon: Axel Filler, MD;  Location: [facility] OR;  Service: General;  Laterality: Left;   No Known Allergies Prior to Admission medications   Medication Sig Start Date End Date Taking? Authorizing Provider  blood glucose meter kit and supplies  Dispense based on patient and insurance preference. Use up to four times daily as directed. (FOR ICD-10 E10.9, E11.9). 08/23/20   Just, Azalee Course, FNP  Continuous Blood Gluc Transmit (DEXCOM G5 MOBILE TRANSMITTER) MISC 1 Application by Does not apply route once a week. Up to 10 days. Patient not taking: Reported on 02/20/2023 08/07/22   Shade Flood, MD  dapagliflozin propanediol (FARXIGA) 10 MG TABS tablet Take 1 tablet (10 mg total) by mouth daily. 02/20/23   Shade Flood, MD  glimepiride (AMARYL) 4 MG tablet Take 1 tablet (4 mg total) by mouth daily before breakfast. 02/20/23   Shade Flood, MD  glucose blood (ONETOUCH VERIO) test strip USE UP TO FOUR TIMES DAILY AS NEEDED TO CHECK BLOOD SUGARS DAILY 09/01/22   Shade Flood, MD  glucose blood (ONETOUCH VERIO) test strip USE UP TO 4 TIMES DAILY AS NEEDED TO CHECK BLOOD SUGAR DAILY 07/13/23   Shade Flood, MD  Lancets [facility] DELICA PLUS [place]) MISC USE UP TO FOUR TIMES DAILY AS DIRECTED 09/16/22   Shade Flood, MD  metFORMIN (GLUCOPHAGE) 500 MG tablet Take 1 tablet (500 mg total) by mouth 2 (two) times daily with a meal. 02/20/23   Shade Flood, MD  rosuvastatin (CRESTOR) 5 MG tablet Take 1 tablet (5 mg total) by mouth at bedtime. 02/20/23   Shade Flood, MD   Social History   Socioeconomic History   Marital status: Married    Spouse name: Not on file  Number of children: 4   Years of education: Not on file   Highest education level: Not on file  Occupational History   Not on file  Tobacco Use   Smoking status: Never   Smokeless tobacco: Never  Vaping Use   Vaping status: Never Used  Substance and Sexual Activity   Alcohol use: No   Drug use: No   Sexual activity: Yes  Other Topics Concern   Not on file  Social History Narrative   Not on file   Social Determinants of Health   Financial Resource Strain: Not on file  Food Insecurity: Not on file  Transportation Needs: Not on file   Physical Activity: Not on file  Stress: Not on file  Social Connections: Unknown (03/07/2022)   Received from [facility], [facility]   Social Network    Social Network: Not on file  Intimate Partner Violence: Unknown (01/27/2022)   Received from [facility], [facility]   HITS    Physically Hurt: Not on file    Insult or Talk Down To: Not on file    Threaten Physical Harm: Not on file    Scream or Curse: Not on file    Review of Systems   Objective:   Vitals:   07/20/23 0912  BP: 124/88  Pulse: 70  Temp: 98.3 F (36.8 C)  TempSrc: Oral  SpO2: 100%  Weight: 273 lb 6.4 oz (124 kg)     Physical Exam Vitals reviewed.  Constitutional:      Appearance: He is well-developed. He is not ill-appearing, toxic-appearing or diaphoretic.  HENT:     Head: Normocephalic and atraumatic.     Right Ear: Tympanic membrane, ear canal and external ear normal.     Left Ear: Tympanic membrane, ear canal and external ear normal.     Nose: No rhinorrhea.     Mouth/Throat:     Pharynx: Posterior oropharyngeal erythema present. No oropharyngeal exudate.  Eyes:     Conjunctiva/sclera: Conjunctivae normal.     Pupils: Pupils are equal, round, and reactive to light.  Cardiovascular:     Rate and Rhythm: Normal rate and regular rhythm.     Heart sounds: Normal heart sounds. No murmur heard. Pulmonary:     Effort: Pulmonary effort is normal.     Breath sounds: Normal breath sounds. No wheezing, rhonchi or rales.  Abdominal:     Palpations: Abdomen is soft.     Tenderness: There is no abdominal tenderness.  Musculoskeletal:     Cervical back: Neck supple.  Lymphadenopathy:     Cervical: No cervical adenopathy.  Skin:    General: Skin is warm and dry.     Findings: No rash.  Neurological:     Mental Status: He is alert and oriented to person, place, and time.  Psychiatric:        Behavior: Behavior normal.    Results for orders placed or performed in visit on 07/20/23  POC  Influenza A&B(BINAX/QUICKVUE)  Result Value Ref Range   Influenza A, POC Negative Negative   Influenza B, POC Negative Negative  POC COVID-19 BinaxNow  Result Value Ref Range   SARS Coronavirus 2 Ag Positive (A) Negative       Assessment & Plan:  Andrew Reeves is a 57 y.o. male . Acute cough - Plan: POC Influenza A&B(BINAX/QUICKVUE), POC COVID-19 BinaxNow Myalgia - Plan: POC Influenza A&B(BINAX/QUICKVUE), POC COVID-19 BinaxNow COVID-19 virus infection  -Day 4 of COVID-19 infection.  Treatment options  discussed.  Appears to be stable for outpatient treatment at this time.  He would like to try antiviral, potential side effects, risks versus benefits discussed.  Also advised that he will need to stop his rosuvastatin for 1 week.  Last GFR noted, full-strength dose.  Symptomatic care with Mucinex, Tessalon Perles as needed, fluids, rest, antipyretics and masking/isolation precautions discussed.  ER/RTC precautions given.   Uncontrolled type 2 diabetes mellitus with hyperglycemia (HCC) - Plan: dapagliflozin propanediol (FARXIGA) 10 MG TABS tablet, glimepiride (AMARYL) 4 MG tablet, metFORMIN (GLUCOPHAGE) 500 MG tablet, rosuvastatin (CRESTOR) 5 MG tablet Hyperlipidemia, unspecified hyperlipidemia type - Plan: rosuvastatin (CRESTOR) 5 MG tablet  -Temporary refill meds provided until follow-up in the next few weeks to review chronic medical conditions and meds.  Plan on fasting labs at that time. No orders of the defined types were placed in this encounter.  There are no Patient Instructions on file for this visit.    Signed,   Meredith Staggers, MD [facility], [facility] 07/20/23 9:22 AM

## 2023-07-24 ENCOUNTER — Ambulatory Visit: Payer: Commercial Managed Care - PPO | Admitting: Family Medicine

## 2023-08-21 ENCOUNTER — Ambulatory Visit: Payer: Commercial Managed Care - PPO | Admitting: Family Medicine

## 2023-08-28 ENCOUNTER — Ambulatory Visit: Payer: Commercial Managed Care - PPO | Admitting: Family Medicine

## 2023-09-18 ENCOUNTER — Ambulatory Visit (INDEPENDENT_AMBULATORY_CARE_PROVIDER_SITE_OTHER): Payer: Commercial Managed Care - PPO | Admitting: Family Medicine

## 2023-09-18 ENCOUNTER — Encounter: Payer: Self-pay | Admitting: Family Medicine

## 2023-09-18 VITALS — BP 122/70 | HR 77 | Temp 97.5°F | Ht 74.0 in | Wt 271.4 lb

## 2023-09-18 DIAGNOSIS — Z125 Encounter for screening for malignant neoplasm of prostate: Secondary | ICD-10-CM

## 2023-09-18 DIAGNOSIS — Z7984 Long term (current) use of oral hypoglycemic drugs: Secondary | ICD-10-CM

## 2023-09-18 DIAGNOSIS — E785 Hyperlipidemia, unspecified: Secondary | ICD-10-CM | POA: Diagnosis not present

## 2023-09-18 DIAGNOSIS — E1165 Type 2 diabetes mellitus with hyperglycemia: Secondary | ICD-10-CM

## 2023-09-18 DIAGNOSIS — E559 Vitamin D deficiency, unspecified: Secondary | ICD-10-CM | POA: Diagnosis not present

## 2023-09-18 LAB — COMPREHENSIVE METABOLIC PANEL
ALT: 33 U/L (ref 0–53)
AST: 30 U/L (ref 0–37)
Albumin: 4.3 g/dL (ref 3.5–5.2)
Alkaline Phosphatase: 64 U/L (ref 39–117)
BUN: 15 mg/dL (ref 6–23)
CO2: 29 meq/L (ref 19–32)
Calcium: 9.1 mg/dL (ref 8.4–10.5)
Chloride: 100 meq/L (ref 96–112)
Creatinine, Ser: 1.05 mg/dL (ref 0.40–1.50)
GFR: 77.49 mL/min (ref >60.00)
Glucose, Bld: 101 mg/dL — ABNORMAL HIGH (ref 70–99)
Potassium: 4.3 meq/L (ref 3.5–5.1)
Sodium: 137 meq/L (ref 135–145)
Total Bilirubin: 1.2 mg/dL (ref 0.2–1.2)
Total Protein: 6.9 g/dL (ref 6.0–8.3)

## 2023-09-18 LAB — LIPID PANEL
Cholesterol: 141 mg/dL (ref 0–200)
HDL: 42 mg/dL (ref >39.00)
LDL Cholesterol: 74 mg/dL (ref 0–99)
NonHDL: 98.97
Total CHOL/HDL Ratio: 3
Triglycerides: 127 mg/dL (ref 0.0–149.0)
VLDL: 25.4 mg/dL (ref 0.0–40.0)

## 2023-09-18 LAB — HEMOGLOBIN A1C: Hgb A1c MFr Bld: 8.5 % — ABNORMAL HIGH (ref 4.6–6.5)

## 2023-09-18 LAB — MICROALBUMIN / CREATININE URINE RATIO
Creatinine,U: 97.9 mg/dL
Microalb Creat Ratio: 2 mg/g (ref 0.0–30.0)
Microalb, Ur: 1.9 mg/dL (ref 0.0–1.9)

## 2023-09-18 LAB — PSA: PSA: 0.14 ng/mL (ref 0.10–4.00)

## 2023-09-18 LAB — VITAMIN D 25 HYDROXY (VIT D DEFICIENCY, FRACTURES): VITD: 33.61 ng/mL (ref 30.00–100.00)

## 2023-09-18 NOTE — Patient Instructions (Addendum)
Diabetes retinopathy test at upcoming eye specialist appointment. Make sure they send me a report.  Flu vaccine at pharmacy as soon as possible.   Depending on labs today I would consider additional medication for diabetes, specifically injection like Mounjaro which we demonstrated today.  If we start that medicine we would start a low-dose initially with recheck in 1 month to decide on higher dosing.  I would stop the glimepiride if we were starting a medication like Mounjaro as that may not be necessary.  I would to go with to review your labs.  If other concerns on labs I will also let you know including if any changes needed for your vitamin D supplement.  Let me know if there are questions and take care!

## 2023-09-18 NOTE — Progress Notes (Signed)
Subjective:  Patient ID: Andrew Reeves, male    DOB: 06/20/1966  Age: 57 y.o. MRN: [id number]  CC:  Chief Complaint  Patient presents with   Diabetes    Pt is doing okay, notes no issues with med changes     HPI Andrew Reeves presents for   Diabetes: Meds temporarily refilled at September 23 acute visit. Symptoms improved since that time. No health changes since last visit. Having a root canal - on amoxicillin. Dental appt this Monday.   Diabetes - complicated by hyperglycemia, microalbuminuria with microalbumin 2.5 in May 2023.  Treated with Farxiga, metformin, glimepiride.  Dosage of metformin has been decreased previously due to diarrhea.  Currently taking metformin 500 mg twice daily. A1c was elevated in April.  Option of GLP-1 for improved control recommended on lab notes.  Has not changed meds. He is on statin with Crestor 5 mg daily. Home readings fasting: 117, 125-130 Postprandial: 140's. Occasional 200 with pizza.  Symptomatic lows: none No med side effects with current med doses. No urinary or mycotic infection symptoms.  Microalbumin: 2.5 on 03/10/2022. No FH of MEN syndrome, medullary thyroid CA, no hx of pancreatitis. Agrees to [facility] if elevated A1C. Optho, foot exam, pneumovax:  Foot exam Diabetic Foot Exam - Simple   Simple Foot Form Visual Inspection No deformities, no ulcerations, no other skin breakdown bilaterally: Yes Sensation Testing Intact to touch and monofilament testing bilaterally: Yes Pulse Check Posterior Tibialis and Dorsalis pulse intact bilaterally: Yes Comments Normal foot exam no concerns from the patient     Ophthalmology exam: appt in 2 weeks.  Flu vaccine: declined today - will have at pharmacy.    Lab Results  Component Value Date   HGBA1C 8.3 (H) 02/20/2023   HGBA1C 7.8 (H) 10/10/2022   HGBA1C 7.9 (H) 07/02/2022   Lab Results  Component Value Date   MICROALBUR 2.5 (H) 03/10/2022   LDLCALC 75 11/21/2021   CREATININE 1.00  02/20/2023   Hyperlipidemia: Crestor 5mg  every day - no new myalgias.  Lab Results  Component Value Date   CHOL 154 07/02/2022   HDL 39.00 (L) 07/02/2022   LDLCALC 75 11/21/2021   LDLDIRECT 73.0 07/02/2022   TRIG 269.0 (H) 07/02/2022   CHOLHDL 4 07/02/2022   Lab Results  Component Value Date   ALT 34 02/20/2023   AST 31 02/20/2023   ALKPHOS 64 02/20/2023   BILITOT 0.8 02/20/2023    Vitamin D Deficiency Supplement every few days. Unknown dose. Low of 18.56 in 10/2021.  Last vitamin D Lab Results  Component Value Date   VD25OH 30.00 07/02/2022    HM: Asking about prostate cancer testing.  No FH of prostate CA.  The natural history of prostate cancer and ongoing controversy regarding screening and potential treatment outcomes of prostate cancer has been discussed with the patient. The meaning of a false positive PSA and a false negative PSA has been discussed. He indicates understanding of the limitations of this screening test and wishes to proceed with screening PSA testing.   History Patient Active Problem List   Diagnosis Date Noted   Hypertriglyceridemia 01/01/2018   Diabetes (HCC) 03/29/2017   Past Medical History:  Diagnosis Date   Diabetes mellitus without complication (HCC)    TYPE 2   Hyperlipidemia    Past Surgical History:  Procedure Laterality Date   HERNIA REPAIR     LIPOMA EXCISION Left 09/06/2020   Procedure: EXCISION OF BACK LIPOMA;  Surgeon: Axel Filler, MD;  Location: [facility] OR;  Service: General;  Laterality: Left;   No Known Allergies Prior to Admission medications   Medication Sig Start Date End Date Taking? Authorizing Provider  blood glucose meter kit and supplies Dispense based on patient and insurance preference. Use up to four times daily as directed. (FOR ICD-10 E10.9, E11.9). 08/23/20  Yes Just, Azalee Course, FNP  Continuous Blood Gluc Transmit (DEXCOM G5 MOBILE TRANSMITTER) MISC 1 Application by Does not apply route once a week. Up to 10  days. 08/07/22  Yes Shade Flood, MD  dapagliflozin propanediol (FARXIGA) 10 MG TABS tablet Take 1 tablet (10 mg total) by mouth daily. 07/20/23  Yes Shade Flood, MD  glimepiride (AMARYL) 4 MG tablet Take 1 tablet (4 mg total) by mouth daily before breakfast. 07/20/23  Yes Shade Flood, MD  glucose blood (ONETOUCH VERIO) test strip USE UP TO FOUR TIMES DAILY AS NEEDED TO CHECK BLOOD SUGARS DAILY 09/01/22  Yes Shade Flood, MD  glucose blood (ONETOUCH VERIO) test strip USE UP TO 4 TIMES DAILY AS NEEDED TO CHECK BLOOD SUGAR DAILY 07/13/23  Yes Shade Flood, MD  Lancets [facility] DELICA PLUS [place]) MISC USE UP TO FOUR TIMES DAILY AS DIRECTED 09/16/22  Yes Shade Flood, MD  metFORMIN (GLUCOPHAGE) 500 MG tablet Take 1 tablet (500 mg total) by mouth 2 (two) times daily with a meal. 07/20/23  Yes Shade Flood, MD  rosuvastatin (CRESTOR) 5 MG tablet Take 1 tablet (5 mg total) by mouth at bedtime. 07/20/23  Yes Shade Flood, MD  benzonatate (TESSALON) 100 MG capsule Take 1 capsule (100 mg total) by mouth 3 (three) times daily as needed for cough. Patient not taking: Reported on 09/18/2023 07/20/23   Shade Flood, MD   Social History   Socioeconomic History   Marital status: Married    Spouse name: Not on file   Number of children: 4   Years of education: Not on file   Highest education level: Not on file  Occupational History   Not on file  Tobacco Use   Smoking status: Never   Smokeless tobacco: Never  Vaping Use   Vaping status: Never Used  Substance and Sexual Activity   Alcohol use: No   Drug use: No   Sexual activity: Yes  Other Topics Concern   Not on file  Social History Narrative   Not on file   Social Determinants of Health   Financial Resource Strain: Not on file  Food Insecurity: Not on file  Transportation Needs: Not on file  Physical Activity: Not on file  Stress: Not on file  Social Connections: Unknown (03/07/2022)   Received  from [facility], [facility]   Social Network    Social Network: Not on file  Intimate Partner Violence: Unknown (01/27/2022)   Received from [facility], [facility]   HITS    Physically Hurt: Not on file    Insult or Talk Down To: Not on file    Threaten Physical Harm: Not on file    Scream or Curse: Not on file    Review of Systems  Constitutional:  Negative for fatigue and unexpected weight change.  Eyes:  Negative for visual disturbance.  Respiratory:  Negative for cough, chest tightness and shortness of breath.   Cardiovascular:  Negative for chest pain, palpitations and leg swelling.  Gastrointestinal:  Negative for abdominal pain and blood in stool.  Neurological:  Negative for dizziness, light-headedness and headaches.  Objective:   Vitals:   09/18/23 0838  BP: 122/70  Pulse: 77  Temp: (!) 97.5 F (36.4 C)  TempSrc: Temporal  SpO2: 98%  Weight: 271 lb 6.4 oz (123.1 kg)  Height: 6\' 2"  (1.88 m)     Physical Exam Vitals reviewed.  Constitutional:      Appearance: He is well-developed.  HENT:     Head: Normocephalic and atraumatic.  Neck:     Vascular: No carotid bruit or JVD.  Cardiovascular:     Rate and Rhythm: Normal rate and regular rhythm.     Heart sounds: Normal heart sounds. No murmur heard. Pulmonary:     Effort: Pulmonary effort is normal.     Breath sounds: Normal breath sounds. No rales.  Musculoskeletal:     Right lower leg: No edema.     Left lower leg: No edema.  Skin:    General: Skin is warm and dry.  Neurological:     Mental Status: He is alert and oriented to person, place, and time.  Psychiatric:        Mood and Affect: Mood normal.        Assessment & Plan:  Andrew Reeves is a 58 y.o. male . Uncontrolled type 2 diabetes mellitus with hyperglycemia (HCC) - Plan: Comprehensive metabolic panel, Hemoglobin A1c, Microalbumin / creatinine urine ratio  -Variable readings recently, elevated A1c in April.  Check A1c,  then consider GLP/GIP injection.  Potential side effects and risks were discussed.  No apparent contraindications based on history above.  Will consider discontinuation of sulfonylurea if starting injectable.  To be determined based on lab results.  No other med changes for now.  Hyperlipidemia, unspecified hyperlipidemia type - Plan: Comprehensive metabolic panel, Lipid panel  -Tolerating statin, continue same.  Check labs and adjust plan accordingly  Vitamin D deficiency - Plan: Vitamin D (25 hydroxy)  -Unknown over-the-counter supplement at this time, check levels and adjust regimen accordingly  Screening for prostate cancer - Plan: PSA   No orders of the defined types were placed in this encounter.  Patient Instructions  Diabetes retinopathy test at upcoming eye specialist appointment. Make sure they send me a report.  Flu vaccine at pharmacy as soon as possible.   Depending on labs today I would consider additional medication for diabetes, specifically injection like Mounjaro which we demonstrated today.  If we start that medicine we would start a low-dose initially with recheck in 1 month to decide on higher dosing.  I would stop the glimepiride if we were starting a medication like Mounjaro as that may not be necessary.  I would to go with to review your labs.  If other concerns on labs I will also let you know including if any changes needed for your vitamin D supplement.  Let me know if there are questions and take care!    Signed,   Meredith Staggers, MD [facility] 09/18/23 9:05 AM

## 2023-09-22 ENCOUNTER — Other Ambulatory Visit: Payer: Self-pay | Admitting: Family Medicine

## 2023-09-22 DIAGNOSIS — E1165 Type 2 diabetes mellitus with hyperglycemia: Secondary | ICD-10-CM

## 2023-09-22 MED ORDER — TIRZEPATIDE 2.5 MG/0.5ML ~~LOC~~ SOAJ: 2.5000 mg | SUBCUTANEOUS | 1 refills | Status: DC

## 2023-09-22 NOTE — Progress Notes (Signed)
See labs and last visit - start mounjaro for improved DM control.

## 2023-09-28 ENCOUNTER — Other Ambulatory Visit (HOSPITAL_COMMUNITY): Payer: Self-pay

## 2023-09-28 ENCOUNTER — Telehealth: Payer: Self-pay

## 2023-09-28 NOTE — Telephone Encounter (Signed)
Pharmacy Patient Advocate Encounter   Received notification from CoverMyMeds that prior authorization for [facility] 2.5MG /0.5ML auto-injectors is required/requested.   Insurance verification completed.   The patient is insured through  [employer]  .   Per test claim: PA required; PA started via CoverMyMeds. KEY [id number] . Waiting for clinical questions to populate.

## 2023-09-29 NOTE — Telephone Encounter (Signed)
Clinical questions answered PA pending

## 2023-10-02 ENCOUNTER — Other Ambulatory Visit (HOSPITAL_COMMUNITY): Payer: Self-pay

## 2023-10-02 NOTE — Telephone Encounter (Signed)
Pharmacy Patient Advocate Encounter  Received notification from  [employer]  that Prior Authorization for [facility] has been APPROVED from 10/01/23 to 09/30/24   PA #/Case ID/Reference #: [id number]

## 2023-10-22 ENCOUNTER — Other Ambulatory Visit: Payer: Self-pay

## 2023-10-22 ENCOUNTER — Telehealth: Payer: Self-pay | Admitting: Family Medicine

## 2023-10-22 DIAGNOSIS — E1165 Type 2 diabetes mellitus with hyperglycemia: Secondary | ICD-10-CM

## 2023-10-22 MED ORDER — GLIMEPIRIDE 4 MG PO TABS
4.0000 mg | ORAL_TABLET | Freq: Every day | ORAL | 1 refills | Status: DC
Start: 2023-10-22 — End: 2023-11-16

## 2023-10-22 MED ORDER — ONETOUCH VERIO VI STRP: ORAL_STRIP | 0 refills | Status: DC

## 2023-10-22 NOTE — Telephone Encounter (Signed)
Test strips have been sent to pharmacy ?

## 2023-10-22 NOTE — Telephone Encounter (Signed)
Pt is on with CRM stating Glimepiride was filled not the test strips which is what he needs.- Advise   Pt would also like a call from CMA.

## 2023-10-22 NOTE — Telephone Encounter (Signed)
Patient Name First: Andrew Last: Reeves Gender: Male DOB: Dec 23, 1965 Age: 57 Y 11 M 21 D Return Phone Number: [phone] (Primary) Address: City/ State/ Zip: [place]  [address] Client [facility] [place] Night - C Client Site [facility] [place] - Night Provider Meredith Staggers- MD Contact Type Call Who Is Calling Patient / Member / Family / Caregiver Call Type Triage / Clinical Relationship To Patient Self Return Phone Number [phone] (Primary) Chief Complaint Prescription Refill or Medication Request (non symptomatic) Reason for Call Medication Question / Request Initial Comment Caller states he is calling about his test strips. They still haven't been sent to the pharmacy yet and he has been callin since the 10th. He states he is completely out and just started a new medication. No symptoms at this time. Translation No Disp. Time Lamount Cohen Time) Disposition Final User 10/20/2023 3:56:03 PM Send To Nurse Laurence Slate, RN, Rhonda 10/21/2023 9:16:34 AM FINAL ATTEMPT MADE - no message left Yes Standifer, RN, Herbert Seta 10/21/2023 9:16:40 AM Send to RN Final Attempt Emi Belfast, RN, Herbert Seta Final Disposition 10/21/2023 9:16:34 AM FINAL ATTEMPT MADE - no message left Yes Standifer, RN, Herbert Seta  Will contact ASAP

## 2023-10-22 NOTE — Telephone Encounter (Signed)
Refill 10/22/2023 Last office visit 09/18/2023 Last refill 1106/2023

## 2023-10-22 NOTE — Telephone Encounter (Signed)
Pt has been notified.

## 2023-11-16 ENCOUNTER — Other Ambulatory Visit: Payer: Self-pay | Admitting: Family Medicine

## 2023-11-16 ENCOUNTER — Ambulatory Visit (INDEPENDENT_AMBULATORY_CARE_PROVIDER_SITE_OTHER): Payer: Commercial Managed Care - PPO | Admitting: Family Medicine

## 2023-11-16 ENCOUNTER — Encounter: Payer: Self-pay | Admitting: Family Medicine

## 2023-11-16 VITALS — BP 124/76 | HR 59 | Temp 98.4°F | Ht 74.0 in | Wt 259.2 lb

## 2023-11-16 DIAGNOSIS — E785 Hyperlipidemia, unspecified: Secondary | ICD-10-CM

## 2023-11-16 DIAGNOSIS — E1165 Type 2 diabetes mellitus with hyperglycemia: Secondary | ICD-10-CM

## 2023-11-16 DIAGNOSIS — Z23 Encounter for immunization: Secondary | ICD-10-CM

## 2023-11-16 LAB — COMPREHENSIVE METABOLIC PANEL
ALT: 24 U/L (ref 0–53)
AST: 22 U/L (ref 0–37)
Albumin: 4.3 g/dL (ref 3.5–5.2)
Alkaline Phosphatase: 57 U/L (ref 39–117)
BUN: 16 mg/dL (ref 6–23)
CO2: 28 meq/L (ref 19–32)
Calcium: 8.9 mg/dL (ref 8.4–10.5)
Chloride: 104 meq/L (ref 96–112)
Creatinine, Ser: 0.98 mg/dL (ref 0.40–1.50)
GFR: 84.09 mL/min (ref >60.00)
Glucose, Bld: 133 mg/dL — ABNORMAL HIGH (ref 70–99)
Potassium: 4.1 meq/L (ref 3.5–5.1)
Sodium: 139 meq/L (ref 135–145)
Total Bilirubin: 0.7 mg/dL (ref 0.2–1.2)
Total Protein: 7.1 g/dL (ref 6.0–8.3)

## 2023-11-16 LAB — HEMOGLOBIN A1C: Hgb A1c MFr Bld: 7.9 % — ABNORMAL HIGH (ref 4.6–6.5)

## 2023-11-16 NOTE — Patient Instructions (Signed)
Thanks for coming in today.  Weight has improved, I expect your diabetes level will also be better.  I will check labs today and let you know.  Flu vaccine and pneumonia vaccine given today.  COVID booster is an option at your pharmacy if you would like.  Follow-up in 3 months but let me know if there are questions prior.  Take care!

## 2023-11-16 NOTE — Progress Notes (Signed)
Subjective:  Patient ID: Andrew Reeves, male    DOB: 09/01/1966  Age: 58 y.o. MRN: [id number]  CC:  Chief Complaint  Patient presents with   Medical Management of Chronic Issues    Pt doing well, notes no questions notes he has lost weight,     HPI [name] presents for   Diabetes: Complicated by hyperglycemia, microalbuminuria.  Treated with Farxiga, metformin, Metformin 500 mg twice daily due to diarrhea with higher dosing.  He is on statin with Crestor 5 mg daily.  Has lost weight since starting mounjaro 2.5mg  weekly.  No n/v/abd pain, neck swelling.  Home readings fasting - 110-120 Postprandial 120-130.  Symptomatic lows: none. Lowest 88-90.   No urinary or mycotic infection symptoms. Microalbumin: Normal ratio 09/18/2023 Optho, foot exam, pneumovax:  Pneumonia vaccine: today Ophthalmology exam - scheduled in 1 week. Report requested.  COVID booster: did not receive - recommended Flu vaccine: today  Wt Readings from Last 3 Encounters:  11/16/23 259 lb 3.2 oz (117.6 kg)  09/18/23 271 lb 6.4 oz (123.1 kg)  07/20/23 273 lb 6.4 oz (124 kg)    Lab Results  Component Value Date   HGBA1C 8.5 (H) 09/18/2023   HGBA1C 8.3 (H) 02/20/2023   HGBA1C 7.8 (H) 10/10/2022   Lab Results  Component Value Date   MICROALBUR 1.9 09/18/2023   LDLCALC 74 09/18/2023   CREATININE 1.05 09/18/2023   Vitamin D supplementation over-the-counter every few days, stable reading in November. Last vitamin D Lab Results  Component Value Date   VD25OH 33.61 09/18/2023     History Patient Active Problem List   Diagnosis Date Noted   Hypertriglyceridemia 01/01/2018   Diabetes (HCC) 03/29/2017   Past Medical History:  Diagnosis Date   Diabetes mellitus without complication (HCC)    TYPE 2   Hyperlipidemia    Past Surgical History:  Procedure Laterality Date   HERNIA REPAIR     LIPOMA EXCISION Left 09/06/2020   Procedure: EXCISION OF BACK LIPOMA;  Surgeon: Axel Filler, MD;   Location: [facility] OR;  Service: General;  Laterality: Left;   No Known Allergies Prior to Admission medications   Medication Sig Start Date End Date Taking? Authorizing Provider  blood glucose meter kit and supplies Dispense based on patient and insurance preference. Use up to four times daily as directed. (FOR ICD-10 E10.9, E11.9). 08/23/20  Yes Just, Azalee Course, FNP  Continuous Blood Gluc Transmit (DEXCOM G5 MOBILE TRANSMITTER) MISC 1 Application by Does not apply route once a week. Up to 10 days. 08/07/22  Yes Shade Flood, MD  dapagliflozin propanediol (FARXIGA) 10 MG TABS tablet Take 1 tablet (10 mg total) by mouth daily. 07/20/23  Yes Shade Flood, MD  glucose blood (ONETOUCH VERIO) test strip USE UP TO FOUR TIMES DAILY AS NEEDED TO CHECK BLOOD SUGARS DAILY 09/01/22  Yes Shade Flood, MD  Lancets [facility] DELICA PLUS [place]) MISC USE UP TO FOUR TIMES DAILY AS DIRECTED 09/16/22  Yes Shade Flood, MD  metFORMIN (GLUCOPHAGE) 500 MG tablet Take 1 tablet (500 mg total) by mouth 2 (two) times daily with a meal. 07/20/23  Yes Shade Flood, MD  rosuvastatin (CRESTOR) 5 MG tablet Take 1 tablet (5 mg total) by mouth at bedtime. 07/20/23  Yes Shade Flood, MD  tirzepatide [facility]) 2.5 MG/0.5ML Pen Inject 2.5 mg into the skin once a week. 09/22/23  Yes Shade Flood, MD  glimepiride (AMARYL) 4 MG tablet Take 1 tablet (  4 mg total) by mouth daily before breakfast. Patient not taking: Reported on 11/16/2023 10/22/23   Shade Flood, MD   Social History   Socioeconomic History   Marital status: Married    Spouse name: Not on file   Number of children: 4   Years of education: Not on file   Highest education level: Not on file  Occupational History   Not on file  Tobacco Use   Smoking status: Never   Smokeless tobacco: Never  Vaping Use   Vaping status: Never Used  Substance and Sexual Activity   Alcohol use: No   Drug use: No   Sexual activity: Yes  Other  Topics Concern   Not on file  Social History Narrative   Not on file   Social Drivers of Health   Financial Resource Strain: Not on file  Food Insecurity: Not on file  Transportation Needs: Not on file  Physical Activity: Not on file  Stress: Not on file  Social Connections: Unknown (03/07/2022)   Received from [facility]   Social Network    Social Network: Not on file  Intimate Partner Violence: Unknown (01/27/2022)   Received from [facility], [facility]   HITS    Physically Hurt: Not on file    Insult or Talk Down To: Not on file    Threaten Physical Harm: Not on file    Scream or Curse: Not on file    Review of Systems  Constitutional:  Negative for fatigue and unexpected weight change.  Eyes:  Negative for visual disturbance.  Respiratory:  Negative for cough, chest tightness and shortness of breath.   Cardiovascular:  Negative for chest pain, palpitations and leg swelling.  Gastrointestinal:  Negative for abdominal pain and blood in stool.  Neurological:  Negative for dizziness, light-headedness and headaches.     Objective:   Vitals:   11/16/23 0824  BP: 124/76  Pulse: (!) 59  Temp: 98.4 F (36.9 C)  TempSrc: Temporal  SpO2: 98%  Weight: 259 lb 3.2 oz (117.6 kg)  Height: 6\' 2"  (1.88 m)     Physical Exam Constitutional:      Appearance: He is well-developed.  HENT:     Head: Normocephalic and atraumatic.  Neck:     Vascular: No carotid bruit or JVD.  Cardiovascular:     Rate and Rhythm: Normal rate and regular rhythm.     Heart sounds: Normal heart sounds. No murmur heard. Pulmonary:     Effort: Pulmonary effort is normal.     Breath sounds: Normal breath sounds. No rales.  Musculoskeletal:     Right lower leg: No edema.     Left lower leg: No edema.  Skin:    General: Skin is warm and dry.  Neurological:     Mental Status: He is alert and oriented to person, place, and time.  Psychiatric:        Mood and Affect: Mood  normal.        Assessment & Plan:  Andrew Reeves is a 58 y.o. male . Uncontrolled type 2 diabetes mellitus with hyperglycemia (HCC) - Plan: Hemoglobin A1c, Comprehensive metabolic panel  Need for pneumococcal vaccination - Plan: Pneumococcal conjugate vaccine 20-valent (Prevnar 20)  Hyperlipidemia, unspecified hyperlipidemia type  Needs flu shot - Plan: Flu vaccine trivalent PF, 6mos and older(Flulaval,Afluria,Fluarix,Fluzone)  Flu and pneumonia vaccines given.  Weight is improved.  I expect A1c to also be better.  Check labs as above and  adjust plan accordingly, no med changes for now.  Tolerating Mounjaro.  Continue same dose for now.  Tolerating current med regimen with statin, continue same.  Recheck lipids next visit.  No orders of the defined types were placed in this encounter.  Patient Instructions  Thanks for coming in today.  Weight has improved, I expect your diabetes level will also be better.  I will check labs today and let you know.  Flu vaccine and pneumonia vaccine given today.  COVID booster is an option at your pharmacy if you would like.  Follow-up in 3 months but let me know if there are questions prior.  Take care!    Signed,   Meredith Staggers, MD [facility], [facility] 11/16/23 8:51 AM

## 2023-11-18 ENCOUNTER — Encounter: Payer: Self-pay | Admitting: Family Medicine

## 2023-11-23 ENCOUNTER — Other Ambulatory Visit: Payer: Self-pay | Admitting: Family Medicine

## 2023-11-23 DIAGNOSIS — E1165 Type 2 diabetes mellitus with hyperglycemia: Secondary | ICD-10-CM

## 2023-11-23 NOTE — Telephone Encounter (Signed)
Pt agrees to increase to [facility], please advise

## 2023-11-24 ENCOUNTER — Other Ambulatory Visit: Payer: Self-pay | Admitting: Family Medicine

## 2023-11-24 DIAGNOSIS — E1165 Type 2 diabetes mellitus with hyperglycemia: Secondary | ICD-10-CM

## 2023-11-24 MED ORDER — TIRZEPATIDE 5 MG/0.5ML ~~LOC~~ SOAJ: 5.0000 mg | SUBCUTANEOUS | 1 refills | Status: DC

## 2023-12-11 ENCOUNTER — Ambulatory Visit: Payer: Commercial Managed Care - PPO | Admitting: Family Medicine

## 2023-12-21 ENCOUNTER — Telehealth: Payer: Self-pay

## 2023-12-21 ENCOUNTER — Other Ambulatory Visit: Payer: Self-pay | Admitting: Family Medicine

## 2023-12-21 DIAGNOSIS — E1165 Type 2 diabetes mellitus with hyperglycemia: Secondary | ICD-10-CM

## 2023-12-21 NOTE — Telephone Encounter (Signed)
 Copied from CRM [id number]. Topic: Clinical - Prescription Issue >> Dec 21, 2023  1:16 PM Taleah C wrote: Reason for CRM: pt called back and stated that he would like for a nurse to give him a call back to discuss his medication for [facility]. Please call back and advise.

## 2023-12-22 MED ORDER — TIRZEPATIDE 5 MG/0.5ML ~~LOC~~ SOAJ: 5.0000 mg | SUBCUTANEOUS | 1 refills | Status: DC

## 2023-12-22 NOTE — Telephone Encounter (Signed)
 We can stay at the 5 mg for now, I sent that to his pharmacy.

## 2023-12-22 NOTE — Addendum Note (Signed)
 Addended by: Meredith Staggers R on: 12/22/2023 10:23 AM   Modules accepted: Orders

## 2023-12-22 NOTE — Telephone Encounter (Signed)
 Patient has been contacted and made aware medication has been sent in

## 2023-12-22 NOTE — Addendum Note (Signed)
 Addended by: Eldred Manges on: 12/22/2023 08:32 AM   Modules accepted: Orders

## 2023-12-22 NOTE — Telephone Encounter (Signed)
 Patient has sent another message about Andrew Reeves and is asking if he should stay at current dose or increase?

## 2024-01-04 ENCOUNTER — Ambulatory Visit: Payer: Self-pay | Admitting: Family Medicine

## 2024-01-04 ENCOUNTER — Ambulatory Visit: Admitting: Student in an Organized Health Care Education/Training Program

## 2024-01-04 ENCOUNTER — Other Ambulatory Visit: Payer: Self-pay | Admitting: Family Medicine

## 2024-01-04 ENCOUNTER — Encounter: Payer: Self-pay | Admitting: Student in an Organized Health Care Education/Training Program

## 2024-01-04 VITALS — BP 120/80 | HR 78 | Temp 98.0°F | Wt 252.0 lb

## 2024-01-04 DIAGNOSIS — E1165 Type 2 diabetes mellitus with hyperglycemia: Secondary | ICD-10-CM | POA: Diagnosis not present

## 2024-01-04 DIAGNOSIS — R112 Nausea with vomiting, unspecified: Secondary | ICD-10-CM | POA: Diagnosis not present

## 2024-01-04 DIAGNOSIS — Z7985 Long-term (current) use of injectable non-insulin antidiabetic drugs: Secondary | ICD-10-CM

## 2024-01-04 DIAGNOSIS — E119 Type 2 diabetes mellitus without complications: Secondary | ICD-10-CM

## 2024-01-04 LAB — CBC WITH DIFFERENTIAL/PLATELET
Basophils Absolute: 0 10*3/uL (ref 0.0–0.1)
Basophils Relative: 0.3 % (ref 0.0–3.0)
Eosinophils Absolute: 0.1 10*3/uL (ref 0.0–0.7)
Eosinophils Relative: 1.6 % (ref 0.0–5.0)
HCT: 51.9 % (ref 39.0–52.0)
Hemoglobin: 16.8 g/dL (ref 13.0–17.0)
Lymphocytes Relative: 20.6 % (ref 12.0–46.0)
Lymphs Abs: 1 10*3/uL (ref 0.7–4.0)
MCHC: 32.4 g/dL (ref 30.0–36.0)
MCV: 85.2 fl (ref 78.0–100.0)
Monocytes Absolute: 0.6 10*3/uL (ref 0.1–1.0)
Monocytes Relative: 12 % (ref 3.0–12.0)
Neutro Abs: 3.1 10*3/uL (ref 1.4–7.7)
Neutrophils Relative %: 65.5 % (ref 43.0–77.0)
Platelets: 162 10*3/uL (ref 150.0–400.0)
RBC: 6.09 Mil/uL — ABNORMAL HIGH (ref 4.22–5.81)
RDW: 14.6 % (ref 11.5–15.5)
WBC: 4.7 10*3/uL (ref 4.0–10.5)

## 2024-01-04 LAB — POCT URINALYSIS DIPSTICK
Bilirubin, UA: NEGATIVE
Blood, UA: NEGATIVE
Glucose, UA: POSITIVE — AB
Ketones, UA: NEGATIVE
Leukocytes, UA: NEGATIVE
Nitrite, UA: NEGATIVE
Protein, UA: POSITIVE — AB
Spec Grav, UA: >=1.03 — AB (ref 1.010–1.025)
Urobilinogen, UA: 0.2 U/dL
pH, UA: 5.5 (ref 5.0–8.0)

## 2024-01-04 LAB — COMPREHENSIVE METABOLIC PANEL
ALT: 25 U/L (ref 0–53)
AST: 21 U/L (ref 0–37)
Albumin: 4.4 g/dL (ref 3.5–5.2)
Alkaline Phosphatase: 67 U/L (ref 39–117)
BUN: 15 mg/dL (ref 6–23)
CO2: 29 meq/L (ref 19–32)
Calcium: 8.9 mg/dL (ref 8.4–10.5)
Chloride: 102 meq/L (ref 96–112)
Creatinine, Ser: 1.05 mg/dL (ref 0.40–1.50)
GFR: 77.33 mL/min (ref >60.00)
Glucose, Bld: 136 mg/dL — ABNORMAL HIGH (ref 70–99)
Potassium: 3.7 meq/L (ref 3.5–5.1)
Sodium: 138 meq/L (ref 135–145)
Total Bilirubin: 0.8 mg/dL (ref 0.2–1.2)
Total Protein: 7.2 g/dL (ref 6.0–8.3)

## 2024-01-04 LAB — VITAMIN B12: Vitamin B-12: 378 pg/mL (ref 211–911)

## 2024-01-04 LAB — LIPASE: Lipase: 16 U/L (ref 11.0–59.0)

## 2024-01-04 LAB — GLUCOSE, POCT (MANUAL RESULT ENTRY): POC Glucose: 134 mg/dL — AB (ref 70–99)

## 2024-01-04 MED ORDER — TIRZEPATIDE 2.5 MG/0.5ML ~~LOC~~ SOAJ: 2.5000 mg | SUBCUTANEOUS | 2 refills | Status: DC

## 2024-01-04 MED ORDER — ONDANSETRON HCL 4 MG PO TABS: 4.0000 mg | ORAL_TABLET | Freq: Three times a day (TID) | ORAL | 0 refills | Status: AC | PRN

## 2024-01-04 NOTE — Telephone Encounter (Signed)
 Noted - appreciate Dr. Oswaldo Done seeing him.

## 2024-01-04 NOTE — Telephone Encounter (Signed)
 FYI Patient is being seen today with Dr.Vincent.

## 2024-01-04 NOTE — Assessment & Plan Note (Signed)
 Acute issue today in the setting of increasing the dosage of mounjaro from 2.5mg  to 5mg . His symptoms today seem more consistent to me with a viral illness rather than GLP1 side effect given associated body aches and diaphoresis. No history of DKA, not insulin dependent, but will check urine for ketones and POC glucose today, along with BMP to rule out DKA. His exam is mostly reassuring, but he does have some abdominal tenderness. History of hypertriglycerides may put him at risk for pancreatitis, so will check lipase and CBC. If these labs return normal, I will recommend supportive care at home with hydration, I prescribed zofran as needed (QT ok on EKG). I recommended holding mounjaro and metformin until his oral intake returns to normal.

## 2024-01-04 NOTE — Assessment & Plan Note (Addendum)
 Possibly having nausea and vomiting side effect from recent increased dose of mounjaro. I recommended holding any further mounjaro until oral intake returns to normal. Then would go back to 2.5mg  dose for a few weeks. Can try again to increase in the future after he recovers from this acute episode. Wife wonders if he is at risk for b12 deficiency due to metformin use, so we will check B12 level which is reasonable.

## 2024-01-04 NOTE — Telephone Encounter (Signed)
  Chief Complaint: nausea/vomiting Symptoms: n/v, headache, diarrhea Frequency: on and off   Disposition: [] ED /[] Urgent Care (no appt availability in office) / [x] Appointment(In office/virtual)/ []  [place] [facility]/ [] Home Care/ [] Refused Recommended Disposition /[] [place] [facility]/ []  Follow-up with PCP Additional Notes: Pt complaining of nausea/vomiting/headache/diarrhea since Friday.  Pt has vomiting 3x and diarrhea 3x in last 24 hours. Stomach sore. Pt has headache and dry mouth. Pt is wondering if this is due to change in medication dosage of Monjaro. Pt to see dr at 0940 today. RN gave care advice. Pt verbalized understanding.             C"opied from CRM [id number]. Topic: Clinical - Red Word Triage >> Jan 04, 2024  8:30 AM Fonda Kinder J wrote: Red Word that prompted transfer to Nurse Triage: Nausea and vomiting, unable to eat/hold down foods ( may be side effects from new medication Reason for Disposition  [1] Vomiting AND [2] abdomen looks much more swollen than usual  Answer Assessment - Initial Assessment Questions 1. VOMITING SEVERITY: "How many times have you vomited in the past 24 hours?"     - MILD:  1 - 2 times/day    - MODERATE: 3 - 5 times/day, decreased oral intake without significant weight loss or symptoms of dehydration    - SEVERE: 6 or more times/day, vomits everything or nearly everything, with significant weight loss, symptoms of dehydration      2-3 2. ONSET: "When did the vomiting begin?"      Friday 3. FLUIDS: "What fluids or food have you vomited up today?" "Have you been able to keep any fluids down?"     Crackers and ginger ale  4. ABDOMEN PAIN: "Are your having any abdomen pain?" If Yes : "How bad is it and what does it feel like?" (e.g., crampy, dull, intermittent, constant)      Yes, sore 5. DIARRHEA: "Is there any diarrhea?" If Yes, ask: "How many times today?"      Yes- 3 6. CONTACTS: "Is there anyone else in the family with the same  symptoms?"      No  7. CAUSE: "What do you think is causing your vomiting?"     Possible medication adjustment  8. HYDRATION STATUS: "Any signs of dehydration?" (e.g., dry mouth [not only dry lips], too weak to stand) "When did you last urinate?"     Mouth dry; urinated this morning  9. OTHER SYMPTOMS: "Do you have any other symptoms?" (e.g., fever, headache, vertigo, vomiting blood or coffee grounds, recent head injury)     Headache  Protocols used: Vomiting-A-AH

## 2024-01-04 NOTE — Telephone Encounter (Signed)
 This RN first attempt to contact for triage- "Call cannot be completed as dialed" received when calling both patient and spouse, placing for callback.

## 2024-01-04 NOTE — Progress Notes (Signed)
 Acute Office Visit  Subjective:     Patient ID: Andrew Reeves, male    DOB: [birth year], 58 y.o.   MRN: [id number]  Chief Complaint  Patient presents with   Nausea    Feeling lethargic to the point he did not go to work Friday. Saturday started vomiting and nausea. Did go up on dosage of monjuro. Took the first new monjuro dose 12/27/2023 and then another does yesterday. No fevers that patient is aware of. Patient states he did have a headache as well.    Diarrhea    Started Saturday as well.     HPI Patient is in today for nausea and vomiting for three days. He increased his dose of mounjaro from 2.5mg  to 5mg  with first dose last Sunday. He started to feel nausea on Friday, it progressed to vomiting and food aversion on Saturday. He still took the dose of mounjaro yesterday. He reports about 3 emesis per day, and two loose BM per day. No blood in either. No fever at home. No respiratory symptoms. No sick contacts or young kids in the house. No history of DKA. No lightheadedness on standing, he does feel a little dehydrated. No chest pain, no dyspnea. He endorses moderate abdominal pain diffusely around his epigastrium, says it feels like soreness from wretching. Also having body aches in his back and arms.       Objective:    BP 120/80   Pulse 78   Temp 98 F (36.7 C) (Temporal)   Wt 252 lb (114.3 kg)   SpO2 98%   BMI 32.35 kg/m    Physical Exam  Gen: tired appearing man Mouth: mildly dry mucus membranes Neck: normal, no thyromegaly Skin: mildly diaphoretic Lungs: clear to auscultation Abd: soft, mild tenderness in the epigastrium Ext: warm, well perfused, no edema.      Assessment & Plan:   Problem List Items Addressed This Visit       Unprioritized   Diabetes (HCC)   Possibly having nausea and vomiting side effect from recent increased dose of mounjaro. I recommended holding any further mounjaro until oral intake returns to normal. Then would go back to 2.5mg  dose  for a few weeks. Can try again to increase in the future after he recovers from this acute episode. Wife wonders if he is at risk for b12 deficiency due to metformin use, so we will check B12 level which is reasonable.       Relevant Medications   tirzepatide (MOUNJARO) 2.5 MG/0.5ML Pen   Other Relevant Orders   B12   Nausea & vomiting - Primary   Acute issue today in the setting of increasing the dosage of mounjaro from 2.5mg  to 5mg . His symptoms today seem more consistent to me with a viral illness rather than GLP1 side effect given associated body aches and diaphoresis. No history of DKA, not insulin dependent, but will check urine for ketones and POC glucose today, along with BMP to rule out DKA. His exam is mostly reassuring, but he does have some abdominal tenderness. History of hypertriglycerides may put him at risk for pancreatitis, so will check lipase and CBC. If these labs return normal, I will recommend supportive care at home with hydration, I prescribed zofran as needed (QT ok on EKG). I recommended holding mounjaro and metformin until his oral intake returns to normal.       Relevant Medications   ondansetron (ZOFRAN) 4 MG tablet   Other Relevant Orders   POCT  urinalysis dipstick   POCT glucose (manual entry) (Completed)   CBC with Differential/Platelet   Comprehensive metabolic panel   Lipase    Meds ordered this encounter  Medications   tirzepatide (MOUNJARO) 2.5 MG/0.5ML Pen    Sig: Inject 2.5 mg into the skin once a week.    Dispense:  2 mL    Refill:  2   ondansetron (ZOFRAN) 4 MG tablet    Sig: Take 1 tablet (4 mg total) by mouth every 8 (eight) hours as needed for nausea or vomiting.    Dispense:  20 tablet    Refill:  0    No follow-ups on file.  Tyson Alias, MD

## 2024-01-05 MED ORDER — ONETOUCH VERIO VI STRP: 1.0000 | ORAL_STRIP | 1 refills | Status: DC

## 2024-01-05 NOTE — Addendum Note (Signed)
 Addended by: Erlinda Hong T on: 01/05/2024 01:02 PM   Modules accepted: Orders

## 2024-01-25 ENCOUNTER — Telehealth: Payer: Self-pay

## 2024-01-25 ENCOUNTER — Other Ambulatory Visit (HOSPITAL_COMMUNITY): Payer: Self-pay

## 2024-01-25 NOTE — Telephone Encounter (Signed)
 Pharmacy Patient Advocate Encounter   Received notification from CoverMyMeds that prior authorization for Mounjaro 2.5MG /0.5ML auto-injectors is required/requested.   Insurance verification completed.   The patient is insured through [facility] .   Per test claim: Refill too soon. PA is not needed at this time. Medication was filled 12/22/23. Next eligible fill date is 03/15/24.

## 2024-02-15 ENCOUNTER — Ambulatory Visit: Payer: Commercial Managed Care - PPO | Admitting: Family Medicine

## 2024-02-21 ENCOUNTER — Other Ambulatory Visit: Payer: Self-pay | Admitting: Family Medicine

## 2024-02-21 DIAGNOSIS — E1165 Type 2 diabetes mellitus with hyperglycemia: Secondary | ICD-10-CM

## 2024-02-21 DIAGNOSIS — R5383 Other fatigue: Secondary | ICD-10-CM

## 2024-02-21 DIAGNOSIS — R197 Diarrhea, unspecified: Secondary | ICD-10-CM

## 2024-03-07 ENCOUNTER — Ambulatory Visit: Admitting: Family Medicine

## 2024-04-11 ENCOUNTER — Encounter: Payer: Self-pay | Admitting: Family Medicine

## 2024-04-11 ENCOUNTER — Ambulatory Visit: Attending: Family Medicine

## 2024-04-11 ENCOUNTER — Ambulatory Visit (INDEPENDENT_AMBULATORY_CARE_PROVIDER_SITE_OTHER): Admitting: Family Medicine

## 2024-04-11 VITALS — BP 118/76 | HR 68 | Ht 74.0 in | Wt 257.0 lb

## 2024-04-11 DIAGNOSIS — E1165 Type 2 diabetes mellitus with hyperglycemia: Secondary | ICD-10-CM | POA: Diagnosis not present

## 2024-04-11 DIAGNOSIS — Z7985 Long-term (current) use of injectable non-insulin antidiabetic drugs: Secondary | ICD-10-CM

## 2024-04-11 DIAGNOSIS — I499 Cardiac arrhythmia, unspecified: Secondary | ICD-10-CM

## 2024-04-11 DIAGNOSIS — E785 Hyperlipidemia, unspecified: Secondary | ICD-10-CM | POA: Diagnosis not present

## 2024-04-11 LAB — LIPID PANEL
Cholesterol: 140 mg/dL (ref 0–200)
HDL: 41.4 mg/dL (ref >39.00)
LDL Cholesterol: 50 mg/dL (ref 0–99)
NonHDL: 99.04
Total CHOL/HDL Ratio: 3
Triglycerides: 244 mg/dL — ABNORMAL HIGH (ref 0.0–149.0)
VLDL: 48.8 mg/dL — ABNORMAL HIGH (ref 0.0–40.0)

## 2024-04-11 LAB — COMPREHENSIVE METABOLIC PANEL WITH GFR
ALT: 22 U/L (ref 0–53)
AST: 20 U/L (ref 0–37)
Albumin: 4.3 g/dL (ref 3.5–5.2)
Alkaline Phosphatase: 64 U/L (ref 39–117)
BUN: 15 mg/dL (ref 6–23)
CO2: 27 meq/L (ref 19–32)
Calcium: 9 mg/dL (ref 8.4–10.5)
Chloride: 103 meq/L (ref 96–112)
Creatinine, Ser: 0.97 mg/dL (ref 0.40–1.50)
GFR: 84.89 mL/min (ref >60.00)
Glucose, Bld: 111 mg/dL — ABNORMAL HIGH (ref 70–99)
Potassium: 4 meq/L (ref 3.5–5.1)
Sodium: 138 meq/L (ref 135–145)
Total Bilirubin: 0.5 mg/dL (ref 0.2–1.2)
Total Protein: 7.1 g/dL (ref 6.0–8.3)

## 2024-04-11 LAB — CBC
HCT: 49.2 % (ref 39.0–52.0)
Hemoglobin: 15.9 g/dL (ref 13.0–17.0)
MCHC: 32.4 g/dL (ref 30.0–36.0)
MCV: 83.4 fl (ref 78.0–100.0)
Platelets: 171 10*3/uL (ref 150.0–400.0)
RBC: 5.9 Mil/uL — ABNORMAL HIGH (ref 4.22–5.81)
RDW: 13.9 % (ref 11.5–15.5)
WBC: 6.1 10*3/uL (ref 4.0–10.5)

## 2024-04-11 LAB — HEMOGLOBIN A1C: Hgb A1c MFr Bld: 6.9 % — ABNORMAL HIGH (ref 4.6–6.5)

## 2024-04-11 LAB — TSH: TSH: 1.27 u[IU]/mL (ref 0.35–5.50)

## 2024-04-11 MED ORDER — METFORMIN HCL 500 MG PO TABS: 500.0000 mg | ORAL_TABLET | Freq: Two times a day (BID) | ORAL | 0 refills | Status: DC

## 2024-04-11 MED ORDER — ROSUVASTATIN CALCIUM 5 MG PO TABS: 5.0000 mg | ORAL_TABLET | Freq: Every day | ORAL | 1 refills | Status: AC

## 2024-04-11 MED ORDER — TIRZEPATIDE 5 MG/0.5ML ~~LOC~~ SOAJ: 5.0000 mg | SUBCUTANEOUS | 2 refills | Status: AC

## 2024-04-11 MED ORDER — DAPAGLIFLOZIN PROPANEDIOL 10 MG PO TABS: 10.0000 mg | ORAL_TABLET | Freq: Every day | ORAL | 1 refills | Status: DC

## 2024-04-11 NOTE — Patient Instructions (Signed)
 Good news on the EKG in the office, I do not see any concerning findings or heart arrhythmia but on your initial exam I did hear an abnormal heart rhythm, with some extra heartbeats.  These may have been the extra heartbeats we discussed that are usually not of concern.  I did order a Zio patch monitor that will be delivered to home to check the heart rhythm over the course of a week to 2 weeks and make sure there are no underlying concerns.  Depending on those results we can decide if other testing needed but I do not think that is so at this time.  If any concerns on other labs I will let you know.  Thanks for coming in today and take care!

## 2024-04-11 NOTE — Progress Notes (Signed)
 Subjective:  Patient ID: Andrew Reeves, male    DOB: Dec 02, 1965  Age: 58 y.o. MRN: [id number]  CC:  Chief Complaint  Patient presents with   Follow-up    3 months follow up / labs pt is fasting for labs,  go over medication ,pt has no new concerns , pt has eye exam scheduled     HPI LOVIE AGRESTA presents for   Diabetes: Complicated by hyperglycemia, microalbuminuria.  Treated with Farxiga , metformin , which has been limited to 500 mg twice daily due to diarrhea with higher dosing and he is on statin with Crestor .  On Mounjaro  as well with some weight loss.  Improving A1c in January.   He was seen in March by my colleague with a nausea vomiting and diarrhea illness.  Normal lipase at that time.  We had increased his dose of Mounjaro  previously, and recommended to temporarily withhold Mounjaro  until oral intake returned to normal then recommended returning to the 2.5 mg dose for a few weeks. Improved in a few days.  Of note B12 level was normal with use of metformin .  Back on Mounjaro  - at 5mg  dose. Denies urinary or mycotic infection symptoms, no recent nausea, vomiting, abdominal pain and no neck swelling.  No new side effects with meds. Home readings fasting: 110-120 Postprandial: 120-130 No symptomatic lows.lowest in 80's.  Microalbumin: Normal ratio 09/18/2023 Optho, foot exam, pneumovax:  Due for eye exam - appt in July. Aware of need for retinopathy screen and report.   Denies history of arrhythmia, denies chest pain, dyspnea, palpitations, or other new symptoms.  No known prior history of irregular heart rhythm.   Last vitamin D  Lab Results  Component Value Date   VD25OH 33.61 09/18/2023  On vitamin D  supplementation every few days.  Stable in November.  Lab Results  Component Value Date   HGBA1C 7.9 (H) 11/16/2023   HGBA1C 8.5 (H) 09/18/2023   HGBA1C 8.3 (H) 02/20/2023   Lab Results  Component Value Date   MICROALBUR 1.9 09/18/2023   LDLCALC 74 09/18/2023    CREATININE 1.05 01/04/2024   Wt Readings from Last 3 Encounters:  04/11/24 257 lb (116.6 kg)  01/04/24 252 lb (114.3 kg)  11/16/23 259 lb 3.2 oz (117.6 kg)    History Patient Active Problem List   Diagnosis Date Noted   Nausea & vomiting 01/04/2024   Hypertriglyceridemia 01/01/2018   Diabetes (HCC) 03/29/2017   Past Medical History:  Diagnosis Date   Diabetes mellitus without complication (HCC)    TYPE 2   Hyperlipidemia    Past Surgical History:  Procedure Laterality Date   HERNIA REPAIR     LIPOMA EXCISION Left 09/06/2020   Procedure: EXCISION OF BACK LIPOMA;  Surgeon: Shela Derby, MD;  Location: [facility] OR;  Service: General;  Laterality: Left;   No Known Allergies Prior to Admission medications   Medication Sig Start Date End Date Taking? Authorizing Provider  blood glucose meter kit and supplies Dispense based on patient and insurance preference. Use up to four times daily as directed. (FOR ICD-10 E10.9, E11.9). 08/23/20  Yes Just, Kelsea J, FNP  dapagliflozin  propanediol (FARXIGA ) 10 MG TABS tablet Take 1 tablet (10 mg total) by mouth daily. 07/20/23  Yes Benjiman Bras, MD  glucose blood (ONETOUCH VERIO) test strip 1 each by Other route See admin instructions. 01/05/24  Yes Ether Hercules, MD  Lancets [facility] DELICA PLUS LANCET33G) MISC USE UP TO FOUR TIMES DAILY AS DIRECTED 09/16/22  Yes  Benjiman Bras, MD  metFORMIN  (GLUCOPHAGE ) 500 MG tablet TAKE 1 TABLET BY MOUTH TWICE DAILY WITH A MEAL 02/22/24  Yes Benjiman Bras, MD  ondansetron  (ZOFRAN ) 4 MG tablet Take 1 tablet (4 mg total) by mouth every 8 (eight) hours as needed for nausea or vomiting. 01/04/24  Yes Ether Hercules, MD  rosuvastatin  (CRESTOR ) 5 MG tablet Take 1 tablet (5 mg total) by mouth at bedtime. 07/20/23  Yes Benjiman Bras, MD  tirzepatide  (MOUNJARO ) 2.5 MG/0.5ML Pen Inject 2.5 mg into the skin once a week. 01/04/24  Yes Ether Hercules, MD  Continuous Blood Gluc Transmit  (DEXCOM G5 MOBILE TRANSMITTER) MISC 1 Application by Does not apply route once a week. Up to 10 days. Patient not taking: Reported on 04/11/2024 08/07/22   Benjiman Bras, MD   Social History   Socioeconomic History   Marital status: Married    Spouse name: Not on file   Number of children: 4   Years of education: Not on file   Highest education level: Master's degree (e.g., MA, MS, MEng, MEd, MSW, MBA)  Occupational History   Not on file  Tobacco Use   Smoking status: Never   Smokeless tobacco: Never  Vaping Use   Vaping status: Never Used  Substance and Sexual Activity   Alcohol use: No   Drug use: No   Sexual activity: Yes  Other Topics Concern   Not on file  Social History Narrative   Not on file   Social Drivers of Health   Financial Resource Strain: Low Risk  (03/06/2024)   Overall Financial Resource Strain (CARDIA)    Difficulty of Paying Living Expenses: Not very hard  Food Insecurity: No Food Insecurity (03/06/2024)   Hunger Vital Sign    Worried About Running Out of Food in the Last Year: Never true    Ran Out of Food in the Last Year: Never true  Transportation Needs: No Transportation Needs (03/06/2024)   PRAPARE - Administrator, Civil Service (Medical): No    Lack of Transportation (Non-Medical): No  Physical Activity: Sufficiently Active (03/06/2024)   Exercise Vital Sign    Days of Exercise per Week: 5 days    Minutes of Exercise per Session: 150+ min  Stress: No Stress Concern Present (03/06/2024)   [employer] of Occupational Health - Occupational Stress Questionnaire    Feeling of Stress : Not at all  Social Connections: Socially Integrated (03/06/2024)   Social Connection and Isolation Panel    Frequency of Communication with Friends and Family: Three times a week    Frequency of Social Gatherings with Friends and Family: Once a week    Attends Religious Services: More than 4 times per year    Active Member of [employer] or  Organizations: Yes    Attends Banker Meetings: 1 to 4 times per year    Marital Status: Married  Catering manager Violence: Unknown (01/27/2022)   Received from [facility]   HITS    Physically Hurt: Not on file    Insult or Talk Down To: Not on file    Threaten Physical Harm: Not on file    Scream or Curse: Not on file    Review of Systems  Constitutional:  Negative for fatigue and unexpected weight change.  Eyes:  Negative for visual disturbance.  Respiratory:  Negative for cough, chest tightness and shortness of breath.   Cardiovascular:  Negative for chest pain, palpitations and leg  swelling.  Gastrointestinal:  Negative for abdominal pain and blood in stool.  Neurological:  Negative for dizziness, light-headedness and headaches.     Objective:   Vitals:   04/11/24 0844  BP: 118/76  Pulse: 68  SpO2: 99%  Weight: 257 lb (116.6 kg)  Height: 6' 2 (1.88 m)     Physical Exam Vitals reviewed.  Constitutional:      Appearance: He is well-developed.  HENT:     Head: Normocephalic and atraumatic.  Neck:     Vascular: No carotid bruit or JVD.   Cardiovascular:     Rate and Rhythm: Normal rate. Rhythm irregular.     Heart sounds: Normal heart sounds. No murmur heard.    Comments: Normal rate but irregular irregular rhythm.  Asymptomatic. Pulmonary:     Effort: Pulmonary effort is normal.     Breath sounds: Normal breath sounds. No rales.   Musculoskeletal:     Right lower leg: No edema.     Left lower leg: No edema.   Skin:    General: Skin is warm and dry.   Neurological:     Mental Status: He is alert and oriented to person, place, and time.   Psychiatric:        Mood and Affect: Mood normal.      EKG, sinus bradycardia with rate 54, no ectopy noted.  Right axis deviation. Compared to prior EKG September 06, 2020.  Rate 60 at that time, also with right axis deviation, no apparent acute changes or acute findings.  10:03 AM Repeat exam  after EKG, regular rhythm, no ectopy.   Assessment & Plan:  FONG MCCARRY is a 58 y.o. male . Uncontrolled type 2 diabetes mellitus with hyperglycemia (HCC) - Plan: dapagliflozin  propanediol (FARXIGA ) 10 MG TABS tablet, metFORMIN  (GLUCOPHAGE ) 500 MG tablet, rosuvastatin  (CRESTOR ) 5 MG tablet, tirzepatide  (MOUNJARO ) 5 MG/0.5ML Pen  - Tolerating current meds including higher dose Mounjaro  at 5 mg.  Will check labs, continue same regimen for now.  Prior nausea/vomiting/diarrhea illness likely foodborne or viral, unlikely related to his meds.  Continue to monitor.  RTC precautions.  Hyperlipidemia, unspecified hyperlipidemia type - Plan: Comprehensive metabolic panel with GFR, Lipid panel, rosuvastatin  (CRESTOR ) 5 MG tablet  - Check labs, tolerating current dose Crestor , continue same.  Adjust plan accordingly based on lab results.  Cardiac arrhythmia, unspecified cardiac arrhythmia type - Plan: CBC, TSH, Comprehensive metabolic panel with GFR, Hemoglobin A1c, EKG 12-Lead, LONG TERM MONITOR (3-14 DAYS)  - Regular regular rhythm on initial exam.  EKG with sinus bradycardia, no ectopy, no concerning arrhythmia, but on repeat exam regular rhythm.  Question of underlying PACs, PVCs or paroxysmal rhythm like A-fib.  Will order Zio patch.  Asymptomatic at this time, RTC precautions given.  Check TSH, CBC.  Meds ordered this encounter  Medications   dapagliflozin  propanediol (FARXIGA ) 10 MG TABS tablet    Sig: Take 1 tablet (10 mg total) by mouth daily.    Dispense:  90 tablet    Refill:  1   metFORMIN  (GLUCOPHAGE ) 500 MG tablet    Sig: Take 1 tablet (500 mg total) by mouth 2 (two) times daily with a meal.    Dispense:  180 tablet    Refill:  0   rosuvastatin  (CRESTOR ) 5 MG tablet    Sig: Take 1 tablet (5 mg total) by mouth at bedtime.    Dispense:  90 tablet    Refill:  1   tirzepatide  (MOUNJARO ) 5 MG/0.5ML Pen  Sig: Inject 5 mg into the skin once a week.    Dispense:  6 mL    Refill:  2    Patient Instructions  Good news on the EKG in the office, I do not see any concerning findings or heart arrhythmia but on your initial exam I did hear an abnormal heart rhythm, with some extra heartbeats.  These may have been the extra heartbeats we discussed that are usually not of concern.  I did order a Zio patch monitor that will be delivered to home to check the heart rhythm over the course of a week to 2 weeks and make sure there are no underlying concerns.  Depending on those results we can decide if other testing needed but I do not think that is so at this time.  If any concerns on other labs I will let you know.  Thanks for coming in today and take care!      Signed,   Caro Christmas, MD [facility], [facility] 04/11/24 10:03 AM

## 2024-04-11 NOTE — Progress Notes (Unsigned)
 EP to read.

## 2024-04-12 ENCOUNTER — Ambulatory Visit: Payer: Self-pay | Admitting: Family Medicine

## 2024-04-12 DIAGNOSIS — I499 Cardiac arrhythmia, unspecified: Secondary | ICD-10-CM

## 2024-04-12 DIAGNOSIS — I493 Ventricular premature depolarization: Secondary | ICD-10-CM

## 2024-04-21 ENCOUNTER — Other Ambulatory Visit: Payer: Self-pay | Admitting: Family Medicine

## 2024-04-21 DIAGNOSIS — E1165 Type 2 diabetes mellitus with hyperglycemia: Secondary | ICD-10-CM

## 2024-04-21 NOTE — Telephone Encounter (Signed)
 Copied from CRM [id number]. Topic: Clinical - Medication Refill >> Apr 21, 2024  3:48 PM Thersia C wrote: Medication: glucose blood (ONETOUCH VERIO) test strip  Has the patient contacted their pharmacy? Yes (Agent: If no, request that the patient contact the pharmacy for the refill. If patient does not wish to contact the pharmacy document the reason why and proceed with request.) (Agent: If yes, when and what did the pharmacy advise?)  This is the patient's preferred pharmacy:  [facility] [address] [place] - [address] [place] [address] Phone: [phone] Fax: [phone]  Is this the correct pharmacy for this prescription? Yes If no, delete pharmacy and type the correct one.   Has the prescription been filled recently? No  Is the patient out of the medication? Yes  Has the patient been seen for an appointment in the last year OR does the patient have an upcoming appointment? Yes  Can we respond through MyChart? Yes  Agent: Please be advised that Rx refills may take up to 3 business days. We ask that you follow-up with your pharmacy.

## 2024-05-03 NOTE — Telephone Encounter (Signed)
 Patient is looking to have a echocardiogram completed due to both heart monitors coming off.

## 2024-05-04 NOTE — Telephone Encounter (Signed)
 The Zio patch monitor should not cause any symptoms, as they are just recording devices for heart rhythm.  I am concerned with his symptoms of feeling lethargic and lightheaded.  Has he checked his blood sugar at these times to make sure it is not dropping low?  Please schedule visit in the next few days to discuss these symptoms further, or to ER if any acute worsening symptoms.

## 2024-05-04 NOTE — Telephone Encounter (Signed)
 Called, had to LM to call back for appt and to discuss

## 2024-05-04 NOTE — Telephone Encounter (Signed)
 Patient states he does not want to use the heart monitors at all due to them making him feel lethargic and light headed both times with the patches and patient sweats heavily at night when he is at work which he believes is contributing to them coming off. Patient is wanting to try something different.

## 2024-05-05 NOTE — Telephone Encounter (Signed)
 Attempted another call today, no answer again

## 2024-05-06 NOTE — Telephone Encounter (Signed)
 Again no answer, LM to return call

## 2024-05-06 NOTE — Telephone Encounter (Signed)
 Patient notes he has checked BG during those times and BG was fine, no appts available today provided ER precautions and made appt for Monday

## 2024-05-09 ENCOUNTER — Ambulatory Visit (INDEPENDENT_AMBULATORY_CARE_PROVIDER_SITE_OTHER): Admitting: Student in an Organized Health Care Education/Training Program

## 2024-05-09 ENCOUNTER — Encounter: Payer: Self-pay | Admitting: Student in an Organized Health Care Education/Training Program

## 2024-05-09 VITALS — BP 140/82 | HR 64 | Wt 255.0 lb

## 2024-05-09 DIAGNOSIS — E119 Type 2 diabetes mellitus without complications: Secondary | ICD-10-CM

## 2024-05-09 DIAGNOSIS — I499 Cardiac arrhythmia, unspecified: Secondary | ICD-10-CM | POA: Diagnosis not present

## 2024-05-09 DIAGNOSIS — Z7985 Long-term (current) use of injectable non-insulin antidiabetic drugs: Secondary | ICD-10-CM

## 2024-05-09 NOTE — Assessment & Plan Note (Signed)
 Irregular heartbeat noted on exam in June.  EKG at that time was sinus bradycardia.  To evaluate for ambulatory arrhythmias like PVC or PAC, a Zio patch was ordered.  Patient had moderate anxiety due to Zio patch sensation. No palpitations or syncope. Functional status maintained.  He tried twice to wear the Zio patch but both times it fell off after only about 3 to 4 days.  He did send his patches back to the company.  We may get a abbreviated reading from those.  Because he is asymptomatic, heart sounds regular today, I told him we do not have to urgently complete the ambulatory monitoring at this time.  If this continues to be an issue, perhaps will be more successful completing this test when the winter months. - Discontinue Zio patch.

## 2024-05-09 NOTE — Progress Notes (Signed)
   Acute Office Visit  Subjective:     Patient ID: Andrew Reeves, male    DOB: [birth year], 58 y.o.   MRN: [id number]  Chief Complaint  Patient presents with   Dizziness    Dizzy, lightheadedness, lethargic. Dr.Greene is concerned about patients BG.     HPI  Discussed the use of AI scribe software for clinical note transcription with the patient, who gave verbal consent to proceed.  History of Present Illness Andrew Reeves is a 58 year old male who presents with issues related to the Zio patch heart monitor.  He feels unwell and not like himself after applying the Zio patch heart monitor, describing feelings of sluggishness and a lack of motivation to work. These symptoms resolved once the patch was removed. He works night shifts and experiences significant sweating, which caused the patch to detach after a few days. A second patch was attempted but also detached after two days due to sweating.  He has a history of palpitations, but subsequent evaluations, including an EKG, were normal. He has not experienced any palpitations, syncope, or concerning episodes at home.  Regarding diabetes management, he reports improvement with a recent HbA1c of 6.9% as of June. He is on Farxiga  and Mounjaro , with the latter at a 5 mg dose for the past two months. He is unsure if he needs a refill for Farxiga  as it is running low. He has experienced weight loss, currently weighing 255 pounds, down from 280-290 pounds, and aims to lose an additional 10 pounds.  He works night shifts, contributing to significant sweating. He denies palpitations, syncope, and nausea.       Objective:    BP (!) 140/82   Pulse 64   Wt 255 lb (115.7 kg)   BMI 32.74 kg/m    Physical Exam  Gen: Well-appearing Heart: Regular, no murmur Lungs: Unlabored and clear Abd:  nontender, normal Ext: Warm, no edema, normal joints      Assessment & Plan:   Problem List Items Addressed This Visit       Unprioritized    Diabetes (HCC)   Diabetes improving with HbA1c of 6.9%. Tolerating Tirzepatide  5 mg well. Weight loss achieved, further loss planned. - Continue Tirzepatide  5 mg subcutaneously weekly.  I offered the patient an increased dose, but he declined, nervous about worsening nausea which has been an issue in the past. - Follow up with Dr. Landy on September 25 for diabetes management.      Irregular heart beat - Primary   Irregular heartbeat noted on exam in June.  EKG at that time was sinus bradycardia.  To evaluate for ambulatory arrhythmias like PVC or PAC, a Zio patch was ordered.  Patient had moderate anxiety due to Zio patch sensation. No palpitations or syncope. Functional status maintained.  He tried twice to wear the Zio patch but both times it fell off after only about 3 to 4 days.  He did send his patches back to the company.  We may get a abbreviated reading from those.  Because he is asymptomatic, heart sounds regular today, I told him we do not have to urgently complete the ambulatory monitoring at this time.  If this continues to be an issue, perhaps will be more successful completing this test when the winter months. - Discontinue Zio patch.       Cleatus Debby Specking, MD

## 2024-05-09 NOTE — Patient Instructions (Signed)
  VISIT SUMMARY: During your visit, we discussed your experience with the Zio patch heart monitor and your diabetes management. You reported feeling unwell and sluggish while using the Zio patch, which resolved after its removal. We also reviewed your diabetes management, noting improvements in your HbA1c levels and weight loss.  YOUR PLAN: -ANXIETY RELATED TO ZIO PATCH USE: You experienced anxiety and discomfort while using the Zio patch heart monitor, which caused you to feel sluggish and unmotivated. Since you did not experience any palpitations or concerning episodes, we decided to discontinue the use of the Zio patch. We will inform Dr. Landy about the limited data collected due to the early removal of the patch.  -DIABETES MELLITUS: Your diabetes is improving, as indicated by your HbA1c level of 6.9%. You have been tolerating your current medication, Tirzepatide , well and have achieved weight loss. Continue taking Tirzepatide  5 mg subcutaneously once a week and monitor your blood glucose levels. You have a follow-up appointment with Dr. Landy on September 25 to further manage your diabetes.  INSTRUCTIONS: Please follow up with Dr. Landy on September 25 for your diabetes management. Continue monitoring your blood glucose levels and taking your medication as prescribed.

## 2024-05-09 NOTE — Assessment & Plan Note (Signed)
 Diabetes improving with HbA1c of 6.9%. Tolerating Tirzepatide  5 mg well. Weight loss achieved, further loss planned. - Continue Tirzepatide  5 mg subcutaneously weekly.  I offered the patient an increased dose, but he declined, nervous about worsening nausea which has been an issue in the past. - Follow up with Dr. Landy on September 25 for diabetes management.

## 2024-05-31 DIAGNOSIS — I499 Cardiac arrhythmia, unspecified: Secondary | ICD-10-CM | POA: Diagnosis not present

## 2024-06-02 NOTE — Addendum Note (Signed)
 Addended by: Kellis Topete R on: 06/02/2024 05:17 PM   Modules accepted: Orders

## 2024-06-02 NOTE — Telephone Encounter (Signed)
 Ordered. Thanks

## 2024-06-02 NOTE — Telephone Encounter (Signed)
 Patient states he is okay with having these referrals placed

## 2024-06-03 ENCOUNTER — Telehealth: Payer: Self-pay

## 2024-06-03 DIAGNOSIS — E785 Hyperlipidemia, unspecified: Secondary | ICD-10-CM

## 2024-06-03 DIAGNOSIS — E119 Type 2 diabetes mellitus without complications: Secondary | ICD-10-CM

## 2024-06-03 NOTE — Telephone Encounter (Signed)
 Called patient to let him know that we are still waiting for Dr. Levora to let us  know. We will reach out when we hear back. Patient was fine with this.

## 2024-06-03 NOTE — Telephone Encounter (Signed)
 Called patient and spoke to wife about order for CT calcium  score.  Wife understands that someone will be contacting them to schedule and that there may be an out of pocket cost.  Wife wants to know if they should get a referral to a cardiologist? And if so, who do you recommend?  Wife also wants to know what could be cause the irregularity he is having?

## 2024-06-03 NOTE — Telephone Encounter (Unsigned)
 Copied from CRM [id number]. Topic: General - Other >> Jun 03, 2024 12:06 PM Deleta RAMAN wrote: Reason for CRM: patient wife calling to speak with nurse regarding message from earlier

## 2024-06-03 NOTE — Addendum Note (Signed)
 Addended by: Amiliah Campisi R on: 06/03/2024 02:09 PM   Modules accepted: Orders

## 2024-06-03 NOTE — Telephone Encounter (Signed)
 History of hyperlipidemia on Crestor .  Given history of diabetes I think that is reasonable to check a coronary calcium  scoring to see if that would change our treatment.  Order was placed.  Please advise patient that I have placed order for test, but that typically is an out-of-pocket test around $100.  He can check into specific cost when they call regarding scheduling the exam.  Let me know if there are further questions.  The 10-year ASCVD risk score (Arnett DK, et al., 2019) is: 16.7%   Values used to calculate the score:     Age: 58 years     Clincally relevant sex: Male     Is Non-Hispanic African American: Yes     Diabetic: Yes     Tobacco smoker: No     Systolic Blood Pressure: 140 mmHg     Is BP treated: No     HDL Cholesterol: 41.4 mg/dL     Total Cholesterol: 140 mg/dL

## 2024-06-03 NOTE — Telephone Encounter (Signed)
 Patients wife would like him to get CT calcium  score, okay to order?

## 2024-06-04 NOTE — Telephone Encounter (Signed)
 See prior note and referrals, I referred him to cardiology on August 7th.   Premature ventricular contractions or PVCs can come from different causes, ultimately it may be best to discuss that further with the cardiologist, or electrophysiologis.  Sometimes PVCs can come from too much caffeine, decreased sleep, but again should discuss further with cardiology.  Happy to discuss this further if needed with a phone call.  Let me know - I can reach out to them either Monday or Tuesday.

## 2024-06-07 NOTE — Telephone Encounter (Signed)
 Called patients Wife, informed her of the message from Dr Levora and she was agreeable, will wait for results and call to get cardiology appt scheduled

## 2024-06-09 ENCOUNTER — Ambulatory Visit (HOSPITAL_COMMUNITY)
Admission: RE | Admit: 2024-06-09 | Discharge: 2024-06-09 | Disposition: A | Discharge status: Home / Self Care | Payer: Self-pay | Source: Ambulatory Visit | Attending: Cardiology | Admitting: Cardiology

## 2024-06-09 ENCOUNTER — Ambulatory Visit: Payer: Self-pay | Admitting: Family Medicine

## 2024-06-09 DIAGNOSIS — E785 Hyperlipidemia, unspecified: Secondary | ICD-10-CM | POA: Insufficient documentation

## 2024-06-09 DIAGNOSIS — E119 Type 2 diabetes mellitus without complications: Secondary | ICD-10-CM | POA: Insufficient documentation

## 2024-07-21 ENCOUNTER — Ambulatory Visit: Admitting: Family Medicine

## 2024-07-22 ENCOUNTER — Ambulatory Visit: Admitting: Student in an Organized Health Care Education/Training Program

## 2024-07-27 ENCOUNTER — Ambulatory Visit
Attending: Student in an Organized Health Care Education/Training Program | Admitting: Student in an Organized Health Care Education/Training Program

## 2024-07-27 ENCOUNTER — Encounter: Payer: Self-pay | Admitting: Student in an Organized Health Care Education/Training Program

## 2024-07-27 VITALS — BP 146/83 | HR 71 | Ht 74.0 in | Wt 263.1 lb

## 2024-07-27 DIAGNOSIS — I499 Cardiac arrhythmia, unspecified: Secondary | ICD-10-CM

## 2024-07-27 DIAGNOSIS — I493 Ventricular premature depolarization: Secondary | ICD-10-CM

## 2024-07-27 NOTE — Patient Instructions (Signed)
 Medication Instructions:  Your physician recommends that you continue on your current medications as directed. Please refer to the Current Medication list given to you today.  *If you need a refill on your cardiac medications before your next appointment, please call your pharmacy*  Lab Work: None ordered.  If you have labs (blood work) drawn today and your tests are completely normal, you will receive your results only by: MyChart Message (if you have MyChart) OR A paper copy in the mail If you have any lab test that is abnormal or we need to change your treatment, we will call you to review the results.  Testing/Procedures: Your physician has requested that you have an echocardiogram. Echocardiography is a painless test that uses sound waves to create images of your heart. It provides your doctor with information about the size and shape of your heart and how well your heart's chambers and valves are working. This procedure takes approximately one hour. There are no restrictions for this procedure. Please do NOT wear cologne, perfume, aftershave, or lotions (deodorant is allowed). Please arrive 15 minutes prior to your appointment time.  Please note: We ask at that you not bring children with you during ultrasound (echo/ vascular) testing. Due to room size and safety concerns, children are not allowed in the ultrasound rooms during exams. Our front office staff cannot provide observation of children in our lobby area while testing is being conducted. An adult accompanying a patient to their appointment will only be allowed in the ultrasound room at the discretion of the ultrasound technician under special circumstances. We apologize for any inconvenience.   Follow-Up: At [facility], you and your health needs are our priority.  As part of our continuing mission to provide you with exceptional heart care, our providers are all part of one team.  This team includes your primary  Cardiologist (physician) and Advanced Practice Providers or APPs (Physician Assistants and Nurse Practitioners) who all work together to provide you with the care you need, when you need it.  Your next appointment:   12 months with Dr Almetta

## 2024-07-27 NOTE — Progress Notes (Signed)
 Cardiology Office Note   Date:  07/27/2024  ID:  Andrew Reeves, DOB Feb 28, 1966, MRN [id number] PCP: Andrew Andrew SAUNDERS, MD  [facility] Providers Cardiologist:  None Electrophysiologist:  Andrew DELENA Primus, MD   History of Present Illness Andrew Reeves is a 58 y.o. male with asx PVCs, DM2 and HLD who presents for evaluation of abnormal OP Zio monitor.   He is followed by Dr. Reyes Reeves and was seen on 04/11/2024 for DM2 and was found to have abnormal rhythm in office which prompted a ZIO monitor.  He is asymptomatic with regards to PVCs.  Recent monitor with 10.6% burden all with similar morphology.  He lives locally with his wife Andrew Reeves.  His wife works in Runner, broadcasting/film/video. He is originally from [facility] [place] [facility], [place]  and she is originally from [place] .  They have been here most of their life and are a foster family with 4 kids total.  2 of which were foster children, the middle daughter was his in the younger daughter they had together.  The youngest is a Holiday representative in college at [employer].  Their middle daughter works in Consulting civil engineer in [place].  He does not have significant family heart history.  His dad passed from a heart attack in his late 81s but smoked most of his life and did have a pacemaker.  ROS: none  Studies Reviewed  ECG review 07/27/24: NSR 71, PR 172, QRS 96, QT/c 418/454, LPFB 04/11/24: SB 54, PR 178, QRS 102, QT/c 430/407 09/06/20: NSR 60, PR 170, QRS 96, QT/c 422/422, low voltage   Zio monitor Result date: 04/18/24-05/11/24 Patient had a min HR of 50 bpm, max HR of 188 bpm, and avg HR of 73 bpm. Predominant underlying rhythm was Sinus Rhythm. 2 Ventricular Tachycardia runs occurred, the run with the fastest interval lasting 12 beats with a max rate of 188 bpm ( avg 163 bpm) ; the run with the fastest interval was also the longest. Isolated SVEs were occasional ( 1. 5% , 2202) , and no SVE Couplets or SVE Triplets were present. Isolated VEs were frequent (  10. 6% , 15667) , VE Couplets were occasional ( 1. 1% , 831) , and no VE Triplets were present. Ventricular Bigeminy and Trigeminy were present. NSR with sinus brady (51/min) and sinus rhythm (92/min), ave hr 73/min. 2 runs of NSVT, longest 12 beats at 188/min. No SVt, atrial fib or sustained VT. No prolonged pauses Symptoms associated with PVC's and NSVT. 1.5% PAC's and 10.6% PVC's Patch Wear Time:  10 days and 12 hours (2025-06-23T15:48:46-0400 to 2025-07-16T22:40:17-0400) Single morphology PVCs of 9.4%, remaining were variations of the same PVC  Physical Exam VS:  BP (!) 146/83 (BP Location: Left Arm, Patient Position: Sitting, Cuff Size: Large)   Pulse 71   Ht 6' 2 (1.88 m)   Wt 263 lb 1.6 oz (119.3 kg)   SpO2 98%   BMI 33.78 kg/m       Wt Readings from Last 3 Encounters:  07/27/24 263 lb 1.6 oz (119.3 kg)  05/09/24 255 lb (115.7 kg)  04/11/24 257 lb (116.6 kg)    GEN: Well nourished, well developed in no acute distress CARDIAC: RRR, no murmurs, rubs, gallops RESPIRATORY:  Clear to auscultation without rales, wheezing or rhonchi  EXTREMITIES:  No edema; No deformity   ASSESSMENT AND PLAN Andrew Reeves is a 58 y.o. male with asx PVCs, DM2 and HLD who presents for evaluation of abnormal OP Zio monitor.  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  He is completely asymptomatic and has no rhythm awareness.  PVCs without typical outflow tract morphology.  Would obtain echocardiogram since his PVC is not a typical morphology though expected his age.  For now would not intervene since he is asymptomatic and presumably has normal LVEF.    Dispo: RTC 1 year, TTE ordered  A total of 45 minutes was spent preparing for the patient, reviewing history, performing exam, document encounter, coordinating care and counseling the patient. 30 minutes was spent with direct patient care.   Signed, Andrew DELENA Primus, MD

## 2024-08-07 ENCOUNTER — Other Ambulatory Visit: Payer: Self-pay | Admitting: Family Medicine

## 2024-08-07 DIAGNOSIS — E1165 Type 2 diabetes mellitus with hyperglycemia: Secondary | ICD-10-CM

## 2024-08-10 ENCOUNTER — Encounter: Payer: Self-pay | Admitting: Family Medicine

## 2024-08-10 ENCOUNTER — Ambulatory Visit: Admitting: Family Medicine

## 2024-08-10 VITALS — BP 136/68 | HR 61 | Temp 97.9°F | Resp 14 | Ht 74.0 in | Wt 262.0 lb

## 2024-08-10 DIAGNOSIS — Z7985 Long-term (current) use of injectable non-insulin antidiabetic drugs: Secondary | ICD-10-CM | POA: Diagnosis not present

## 2024-08-10 DIAGNOSIS — E559 Vitamin D deficiency, unspecified: Secondary | ICD-10-CM

## 2024-08-10 DIAGNOSIS — I499 Cardiac arrhythmia, unspecified: Secondary | ICD-10-CM

## 2024-08-10 DIAGNOSIS — E1165 Type 2 diabetes mellitus with hyperglycemia: Secondary | ICD-10-CM

## 2024-08-10 DIAGNOSIS — Z7984 Long term (current) use of oral hypoglycemic drugs: Secondary | ICD-10-CM

## 2024-08-10 LAB — VITAMIN D 25 HYDROXY (VIT D DEFICIENCY, FRACTURES): VITD: 17.22 ng/mL — ABNORMAL LOW (ref 30.00–100.00)

## 2024-08-10 LAB — COMPREHENSIVE METABOLIC PANEL WITH GFR
ALT: 28 U/L (ref 0–53)
AST: 29 U/L (ref 0–37)
Albumin: 4.5 g/dL (ref 3.5–5.2)
Alkaline Phosphatase: 57 U/L (ref 39–117)
BUN: 17 mg/dL (ref 6–23)
CO2: 28 meq/L (ref 19–32)
Calcium: 9.3 mg/dL (ref 8.4–10.5)
Chloride: 104 meq/L (ref 96–112)
Creatinine, Ser: 1.07 mg/dL (ref 0.40–1.50)
GFR: 75.28 mL/min (ref >60.00)
Glucose, Bld: 86 mg/dL (ref 70–99)
Potassium: 4.8 meq/L (ref 3.5–5.1)
Sodium: 141 meq/L (ref 135–145)
Total Bilirubin: 0.6 mg/dL (ref 0.2–1.2)
Total Protein: 7 g/dL (ref 6.0–8.3)

## 2024-08-10 LAB — MICROALBUMIN / CREATININE URINE RATIO
Creatinine,U: 143.6 mg/dL
Microalb Creat Ratio: 19.8 mg/g (ref 0.0–30.0)
Microalb, Ur: 2.8 mg/dL — ABNORMAL HIGH (ref 0.0–1.9)

## 2024-08-10 LAB — HEMOGLOBIN A1C: Hgb A1c MFr Bld: 6.6 % — ABNORMAL HIGH (ref 4.6–6.5)

## 2024-08-10 NOTE — Patient Instructions (Signed)
 Thank you for coming in today. No change in medications at this time, but depending on your A1c we may decide to just stop the glimepiride .  We can also decide if any vitamin D  supplement is needed based on levels today.  Keep follow-up with cardiology including for echocardiogram in the next few months but if any new heart palpitations or new symptoms to be seen.  Glad you are doing well. Take care!

## 2024-08-10 NOTE — Progress Notes (Signed)
 Subjective:  Patient ID: Andrew Reeves, male    DOB: 1966-09-29  Age: 58 y.o. MRN: [id number]  CC:  Chief Complaint  Patient presents with   Diabetes    No questions or concerns.     HPI Andrew Reeves presents for   Diabetes: Complicated by hyperglycemia, microalbuminuria, treated with Farxiga , metformin  limited to 500 mg twice daily due to intolerance to higher dosing and is on statin with Crestor . Amaryl  4mg  (2-3 times per week only - no specific timing or blood sugar, but sometimes dietary indiscretion night before).  Mounjaro  5 mg dose as of his June visit, which has assisted with diabetes and weight loss.  Temporary diarrhea, vomiting illness discussed earlier this year, normal lipase.  Temporarily held Mounjaro , and able to return to the 5 mg dosing as of his June visit. Denies recent nausea vomiting abdominal pain mycotic or urinary tract infection symptoms with use of meds above. Home readings fasting: 112 range Home readings postprandial: 150 range No symptomatic lows: none  Microalbumin: 09/18/2023 Optho, foot exam, pneumovax: optho  exam in November.   Lab Results  Component Value Date   HGBA1C 6.9 (H) 04/11/2024   HGBA1C 7.9 (H) 11/16/2023   HGBA1C 8.5 (H) 09/18/2023   Lab Results  Component Value Date   LDLCALC 50 04/11/2024   CREATININE 0.97 04/11/2024   Vitamin D  deficiency Over-the-counter supplementation every few days when discussed in June. No recent use.  Level of 18.81 in 2022, 18.56 in 2023. Last vitamin D  Lab Results  Component Value Date   VD25OH 33.61 09/18/2023   History of PVCs Recent cardiology note reviewed from October 1.  PVCs with 10% burden.  Asymptomatic and no rhythm awareness.  PVCs were without typical outflow tract morphology, plan for echocardiogram since PVC not atypical morphology, and no interventions planned with presumable normal LVEF, 1 year follow-up along with TTE ordered.  Echo ordered for January  9.     History Patient Active Problem List   Diagnosis Date Noted   Irregular heart beat 05/09/2024   Hypertriglyceridemia 01/01/2018   Diabetes (HCC) 03/29/2017   Past Medical History:  Diagnosis Date   Diabetes mellitus without complication (HCC)    TYPE 2   Hyperlipidemia    Past Surgical History:  Procedure Laterality Date   HERNIA REPAIR     LIPOMA EXCISION Left 09/06/2020   Procedure: EXCISION OF BACK LIPOMA;  Surgeon: Rubin Calamity, MD;  Location: [facility] OR;  Service: General;  Laterality: Left;   No Known Allergies Prior to Admission medications   Medication Sig Start Date End Date Taking? Authorizing Provider  blood glucose meter kit and supplies Dispense based on patient and insurance preference. Use up to four times daily as directed. (FOR ICD-10 E10.9, E11.9). 08/23/20  Yes Just, Kelsea J, FNP  dapagliflozin  propanediol (FARXIGA ) 10 MG TABS tablet Take 1 tablet (10 mg total) by mouth daily. 04/11/24  Yes Levora Reyes SAUNDERS, MD  glimepiride  (AMARYL ) 4 MG tablet Take 4 mg by mouth every morning. 07/09/24  Yes [provider]  glucose blood (ONETOUCH VERIO) test strip 1 each by Other route See admin instructions. 01/05/24  Yes Jerrell Cleatus Ned, MD  Lancets [facility] Hights DELICA PLUS [place]) MISC USE UP TO FOUR TIMES DAILY AS DIRECTED 09/16/22  Yes Levora Reyes SAUNDERS, MD  metFORMIN  (GLUCOPHAGE ) 500 MG tablet TAKE 1 TABLET BY MOUTH TWICE DAILY WITH A MEAL 08/08/24  Yes Levora Reyes SAUNDERS, MD  rosuvastatin  (CRESTOR ) 5 MG tablet Take 1  tablet (5 mg total) by mouth at bedtime. 04/11/24  Yes Levora Reyes SAUNDERS, MD  tirzepatide  (MOUNJARO ) 5 MG/0.5ML Pen Inject 5 mg into the skin once a week. 04/11/24  Yes Levora Reyes SAUNDERS, MD  Continuous Blood Gluc Transmit (DEXCOM G5 MOBILE TRANSMITTER) MISC 1 Application by Does not apply route once a week. Up to 10 days. Patient not taking: Reported on 08/10/2024 08/07/22   Levora Reyes SAUNDERS, MD  ondansetron  (ZOFRAN ) 4 MG tablet Take 1  tablet (4 mg total) by mouth every 8 (eight) hours as needed for nausea or vomiting. Patient not taking: Reported on 08/10/2024 01/04/24   Jerrell Cleatus Ned, MD   Social History   Socioeconomic History   Marital status: Married    Spouse name: Not on file   Number of children: 4   Years of education: Not on file   Highest education level: Master's degree (e.g., MA, MS, MEng, MEd, MSW, MBA)  Occupational History   Not on file  Tobacco Use   Smoking status: Never   Smokeless tobacco: Never  Vaping Use   Vaping status: Never Used  Substance and Sexual Activity   Alcohol use: No   Drug use: No   Sexual activity: Yes  Other Topics Concern   Not on file  Social History Narrative   Not on file   Social Drivers of Health   Financial Resource Strain: Low Risk  (03/06/2024)   Overall Financial Resource Strain (CARDIA)    Difficulty of Paying Living Expenses: Not very hard  Food Insecurity: No Food Insecurity (03/06/2024)   Hunger Vital Sign    Worried About Running Out of Food in the Last Year: Never true    Ran Out of Food in the Last Year: Never true  Transportation Needs: No Transportation Needs (03/06/2024)   PRAPARE - Administrator, Civil Service (Medical): No    Lack of Transportation (Non-Medical): No  Physical Activity: Sufficiently Active (03/06/2024)   Exercise Vital Sign    Days of Exercise per Week: 5 days    Minutes of Exercise per Session: 150+ min  Stress: No Stress Concern Present (03/06/2024)   [employer] of Occupational Health - Occupational Stress Questionnaire    Feeling of Stress : Not at all  Social Connections: Socially Integrated (03/06/2024)   Social Connection and Isolation Panel    Frequency of Communication with Friends and Family: Three times a week    Frequency of Social Gatherings with Friends and Family: Once a week    Attends Religious Services: More than 4 times per year    Active Member of [employer] or Organizations: Yes     Attends Banker Meetings: 1 to 4 times per year    Marital Status: Married  Catering manager Violence: Unknown (01/27/2022)   Received from [facility]   HITS    Physically Hurt: Not on file    Insult or Talk Down To: Not on file    Threaten Physical Harm: Not on file    Scream or Curse: Not on file    Review of Systems  Constitutional:  Negative for fatigue and unexpected weight change.  Eyes:  Negative for visual disturbance.  Respiratory:  Negative for cough, chest tightness and shortness of breath.   Cardiovascular:  Negative for chest pain, palpitations and leg swelling.  Gastrointestinal:  Negative for abdominal pain and blood in stool.  Neurological:  Negative for dizziness, light-headedness and headaches.     Objective:   Vitals:  08/10/24 0848  BP: 136/68  Pulse: 61  Resp: 14  Temp: 97.9 F (36.6 C)  TempSrc: Temporal  SpO2: 98%  Weight: 262 lb (118.8 kg)  Height: 6' 2 (1.88 m)     Physical Exam Vitals reviewed.  Constitutional:      Appearance: He is well-developed.  HENT:     Head: Normocephalic and atraumatic.  Neck:     Vascular: No carotid bruit or JVD.  Cardiovascular:     Rate and Rhythm: Normal rate and regular rhythm.     Heart sounds: Normal heart sounds. No murmur heard. Pulmonary:     Effort: Pulmonary effort is normal.     Breath sounds: Normal breath sounds. No rales.  Musculoskeletal:     Right lower leg: No edema.     Left lower leg: No edema.  Skin:    General: Skin is warm and dry.  Neurological:     Mental Status: He is alert and oriented to person, place, and time.  Psychiatric:        Mood and Affect: Mood normal.        Assessment & Plan:  Andrew Reeves is a 58 y.o. male . Type 2 diabetes mellitus with hyperglycemia, without long-term current use of insulin (HCC) - Plan: Urine Albumin/Creatinine with ratio (send out) [LAB689], Comprehensive metabolic panel with GFR, Hemoglobin A1c  - Check labs,  likely will discontinue glimepiride  if still controlled A1c given infrequent use.  Could certainly increase Mounjaro  if continues to tolerate well and borderline control with discontinuation of glimepiride .  Will decide upon lab results.  Irregular heart beat  - PVCs as above with planned echo in the next few months, asymptomatic, continue to monitor and follow-up with cardiology.  Vitamin D  deficiency - Plan: Vitamin D  (25 hydroxy)  - No recent supplementation, check baseline level to decide on need for treatment.  Prior deficiency.  No orders of the defined types were placed in this encounter.  Patient Instructions  Thank you for coming in today. No change in medications at this time, but depending on your A1c we may decide to just stop the glimepiride .  We can also decide if any vitamin D  supplement is needed based on levels today.  Keep follow-up with cardiology including for echocardiogram in the next few months but if any new heart palpitations or new symptoms to be seen.  Glad you are doing well. Take care!     Signed,   Reyes Pines, MD [facility], [facility] 08/10/24 9:02 AM

## 2024-08-16 ENCOUNTER — Other Ambulatory Visit: Payer: Self-pay | Admitting: Family Medicine

## 2024-08-16 DIAGNOSIS — E1165 Type 2 diabetes mellitus with hyperglycemia: Secondary | ICD-10-CM

## 2024-08-17 ENCOUNTER — Ambulatory Visit: Payer: Self-pay | Admitting: Family Medicine

## 2024-09-04 ENCOUNTER — Other Ambulatory Visit: Payer: Self-pay | Admitting: Family Medicine

## 2024-09-04 DIAGNOSIS — E1165 Type 2 diabetes mellitus with hyperglycemia: Secondary | ICD-10-CM

## 2024-09-05 ENCOUNTER — Other Ambulatory Visit (HOSPITAL_COMMUNITY): Payer: Self-pay

## 2024-09-12 ENCOUNTER — Other Ambulatory Visit (HOSPITAL_COMMUNITY): Payer: Self-pay

## 2024-09-12 ENCOUNTER — Telehealth: Payer: Self-pay | Admitting: Pharmacy Technician

## 2024-09-12 NOTE — Telephone Encounter (Signed)
 Pharmacy Patient Advocate Encounter   Received notification from Onbase that prior authorization for Mounjaro  2.5mg /0.68ml is due for renewal.   Insurance verification completed.   The patient is insured through [facility].  Action: Medication has been discontinued. Archived Key: [id number]

## 2024-09-13 ENCOUNTER — Other Ambulatory Visit: Payer: Self-pay | Admitting: Student in an Organized Health Care Education/Training Program

## 2024-09-13 DIAGNOSIS — E1165 Type 2 diabetes mellitus with hyperglycemia: Secondary | ICD-10-CM

## 2024-10-18 ENCOUNTER — Other Ambulatory Visit: Payer: Self-pay | Admitting: Family Medicine

## 2024-10-18 DIAGNOSIS — E1165 Type 2 diabetes mellitus with hyperglycemia: Secondary | ICD-10-CM

## 2024-10-26 ENCOUNTER — Other Ambulatory Visit: Payer: Self-pay | Admitting: Family Medicine

## 2024-10-26 DIAGNOSIS — E1165 Type 2 diabetes mellitus with hyperglycemia: Secondary | ICD-10-CM

## 2024-10-26 LAB — OPHTHALMOLOGY REPORT-SCANNED

## 2024-10-28 ENCOUNTER — Telehealth: Payer: Self-pay | Admitting: Family Medicine

## 2024-10-28 NOTE — Telephone Encounter (Signed)
 Copied from CRM #[id number]. Topic: Medical Record Request - Other >> Oct 26, 2024  3:55 PM Andrew Reeves wrote: Reason for CRM: Patient called to inform provider that he had an eye exam today and they should be faxing office notes over.

## 2024-10-28 NOTE — Telephone Encounter (Signed)
 noted

## 2024-11-04 ENCOUNTER — Ambulatory Visit (HOSPITAL_COMMUNITY)

## 2024-11-10 ENCOUNTER — Ambulatory Visit: Admitting: Family Medicine

## 2024-11-20 ENCOUNTER — Other Ambulatory Visit: Payer: Self-pay | Admitting: Family Medicine

## 2024-11-20 DIAGNOSIS — E1165 Type 2 diabetes mellitus with hyperglycemia: Secondary | ICD-10-CM

## 2024-12-07 ENCOUNTER — Ambulatory Visit (HOSPITAL_COMMUNITY)

## 2024-12-12 ENCOUNTER — Ambulatory Visit: Admitting: Family Medicine
